# Patient Record
Sex: Female | Born: 1935 | Race: White | Hispanic: No | Smoking: Former smoker
Health system: Southern US, Community
[De-identification: ages and names within clinical notes are randomized; demographics above are authoritative.]

## PROBLEM LIST (undated history)

## (undated) DIAGNOSIS — E538 Deficiency of other specified B group vitamins: Secondary | ICD-10-CM

## (undated) DIAGNOSIS — F329 Major depressive disorder, single episode, unspecified: Secondary | ICD-10-CM

## (undated) DIAGNOSIS — J4489 Other specified chronic obstructive pulmonary disease: Secondary | ICD-10-CM

## (undated) DIAGNOSIS — F3289 Other specified depressive episodes: Secondary | ICD-10-CM

## (undated) DIAGNOSIS — F411 Generalized anxiety disorder: Secondary | ICD-10-CM

## (undated) DIAGNOSIS — J449 Chronic obstructive pulmonary disease, unspecified: Secondary | ICD-10-CM

## (undated) DIAGNOSIS — I10 Essential (primary) hypertension: Secondary | ICD-10-CM

## (undated) DIAGNOSIS — D696 Thrombocytopenia, unspecified: Secondary | ICD-10-CM

## (undated) DIAGNOSIS — K219 Gastro-esophageal reflux disease without esophagitis: Secondary | ICD-10-CM

## (undated) HISTORY — PX: ABDOMINOPLASTY: SHX5355

## (undated) HISTORY — DX: Other specified depressive episodes: F32.89

## (undated) HISTORY — DX: Gastro-esophageal reflux disease without esophagitis: K21.9

## (undated) HISTORY — PX: BREAST BIOPSY: SHX20

## (undated) HISTORY — DX: Deficiency of other specified B group vitamins: E53.8

## (undated) HISTORY — DX: Generalized anxiety disorder: F41.1

## (undated) HISTORY — DX: Thrombocytopenia, unspecified: D69.6

## (undated) HISTORY — PX: TOTAL ABDOMINAL HYSTERECTOMY: SHX209

## (undated) HISTORY — DX: Other specified chronic obstructive pulmonary disease: J44.89

## (undated) HISTORY — DX: Chronic obstructive pulmonary disease, unspecified: J44.9

## (undated) HISTORY — DX: Major depressive disorder, single episode, unspecified: F32.9

## (undated) HISTORY — DX: Essential (primary) hypertension: I10

---

## 1999-03-09 ENCOUNTER — Ambulatory Visit (HOSPITAL_COMMUNITY): Admission: RE | Admit: 1999-03-09 | Discharge: 1999-03-09 | Payer: Self-pay | Admitting: *Deleted

## 2000-03-21 ENCOUNTER — Ambulatory Visit (HOSPITAL_COMMUNITY): Admission: RE | Admit: 2000-03-21 | Discharge: 2000-03-21 | Payer: Self-pay | Admitting: Obstetrics and Gynecology

## 2000-03-21 ENCOUNTER — Encounter (INDEPENDENT_AMBULATORY_CARE_PROVIDER_SITE_OTHER): Payer: Self-pay | Admitting: Specialist

## 2001-01-27 ENCOUNTER — Encounter: Admission: RE | Admit: 2001-01-27 | Discharge: 2001-01-27 | Payer: Self-pay | Admitting: Internal Medicine

## 2001-01-27 ENCOUNTER — Encounter: Payer: Self-pay | Admitting: Internal Medicine

## 2002-01-25 ENCOUNTER — Other Ambulatory Visit: Admission: RE | Admit: 2002-01-25 | Discharge: 2002-01-25 | Payer: Self-pay | Admitting: Internal Medicine

## 2002-01-31 ENCOUNTER — Ambulatory Visit (HOSPITAL_COMMUNITY): Admission: RE | Admit: 2002-01-31 | Discharge: 2002-01-31 | Payer: Self-pay | Admitting: Internal Medicine

## 2003-02-14 ENCOUNTER — Encounter: Payer: Self-pay | Admitting: Gastroenterology

## 2003-02-14 ENCOUNTER — Encounter: Admission: RE | Admit: 2003-02-14 | Discharge: 2003-02-14 | Payer: Self-pay | Admitting: Gastroenterology

## 2003-07-18 ENCOUNTER — Encounter: Admission: RE | Admit: 2003-07-18 | Discharge: 2003-07-18 | Payer: Self-pay | Admitting: Internal Medicine

## 2003-07-18 ENCOUNTER — Encounter: Payer: Self-pay | Admitting: Internal Medicine

## 2004-09-08 ENCOUNTER — Ambulatory Visit (HOSPITAL_COMMUNITY): Admission: RE | Admit: 2004-09-08 | Discharge: 2004-09-08 | Payer: Self-pay | Admitting: Internal Medicine

## 2005-01-18 ENCOUNTER — Other Ambulatory Visit: Admission: RE | Admit: 2005-01-18 | Discharge: 2005-01-18 | Payer: Self-pay | Admitting: Internal Medicine

## 2006-07-04 ENCOUNTER — Encounter: Admission: RE | Admit: 2006-07-04 | Discharge: 2006-07-04 | Payer: Self-pay | Admitting: Orthopedic Surgery

## 2006-07-21 ENCOUNTER — Encounter: Admission: RE | Admit: 2006-07-21 | Discharge: 2006-07-21 | Payer: Self-pay | Admitting: Internal Medicine

## 2006-07-28 ENCOUNTER — Ambulatory Visit: Payer: Self-pay | Admitting: Hematology and Oncology

## 2006-07-29 LAB — CBC WITH DIFFERENTIAL/PLATELET
BASO%: 0.3 % (ref 0.0–2.0)
EOS%: 2.6 % (ref 0.0–7.0)
HCT: 44.3 % (ref 34.8–46.6)
LYMPH%: 25 % (ref 14.0–48.0)
MCH: 30 pg (ref 26.0–34.0)
MCHC: 34.6 g/dL (ref 32.0–36.0)
MCV: 86.7 fL (ref 81.0–101.0)
MONO#: 0.6 10*3/uL (ref 0.1–0.9)
MONO%: 9.8 % (ref 0.0–13.0)
NEUT%: 62.3 % (ref 39.6–76.8)
Platelets: 121 10*3/uL — ABNORMAL LOW (ref 145–400)

## 2006-07-29 LAB — COMPREHENSIVE METABOLIC PANEL
ALT: 33 U/L (ref 0–40)
Alkaline Phosphatase: 45 U/L (ref 39–117)
CO2: 32 mEq/L (ref 19–32)
Creatinine, Ser: 0.93 mg/dL (ref 0.40–1.20)
Total Bilirubin: 0.7 mg/dL (ref 0.3–1.2)

## 2006-08-05 LAB — PROTEIN ELECTROPHORESIS, SERUM
Albumin ELP: 64.2 % (ref 55.8–66.1)
Alpha-1-Globulin: 4.8 % (ref 2.9–4.9)
Alpha-2-Globulin: 10.9 % (ref 7.1–11.8)
Beta Globulin: 7.1 % (ref 4.7–7.2)
Total Protein, Serum Electrophoresis: 7.3 g/dL (ref 6.0–8.3)

## 2006-08-05 LAB — VON WILLEBRAND PANEL
Ristocetin-Cofactor: 91 % (ref 50–150)
Von Willebrand Ag: 89 % normal (ref 61–164)

## 2006-08-10 LAB — CBC WITH DIFFERENTIAL/PLATELET
Basophils Absolute: 0 10*3/uL (ref 0.0–0.1)
EOS%: 2.1 % (ref 0.0–7.0)
Eosinophils Absolute: 0.1 10*3/uL (ref 0.0–0.5)
HGB: 14.9 g/dL (ref 11.6–15.9)
MCH: 30 pg (ref 26.0–34.0)
NEUT#: 4.5 10*3/uL (ref 1.5–6.5)
RDW: 15.1 % — ABNORMAL HIGH (ref 11.3–14.5)
lymph#: 1.4 10*3/uL (ref 0.9–3.3)

## 2006-08-31 LAB — VITAMIN B12: Vitamin B-12: 898 pg/mL (ref 211–911)

## 2006-09-28 ENCOUNTER — Ambulatory Visit: Payer: Self-pay | Admitting: Hematology and Oncology

## 2006-10-20 ENCOUNTER — Encounter: Admission: RE | Admit: 2006-10-20 | Discharge: 2006-10-20 | Payer: Self-pay | Admitting: Internal Medicine

## 2006-11-30 ENCOUNTER — Ambulatory Visit: Payer: Self-pay | Admitting: Hematology and Oncology

## 2006-12-23 LAB — CBC WITH DIFFERENTIAL/PLATELET
Eosinophils Absolute: 0.1 10*3/uL (ref 0.0–0.5)
LYMPH%: 22.2 % (ref 14.0–48.0)
MONO#: 0.6 10*3/uL (ref 0.1–0.9)
NEUT#: 4.4 10*3/uL (ref 1.5–6.5)
Platelets: 128 10*3/uL — ABNORMAL LOW (ref 145–400)
RBC: 4.96 10*6/uL (ref 3.70–5.32)
WBC: 6.7 10*3/uL (ref 3.9–10.0)
lymph#: 1.5 10*3/uL (ref 0.9–3.3)

## 2006-12-23 LAB — VITAMIN B12: Vitamin B-12: 620 pg/mL (ref 211–911)

## 2006-12-23 LAB — COMPREHENSIVE METABOLIC PANEL
ALT: 37 U/L — ABNORMAL HIGH (ref 0–35)
Albumin: 4.6 g/dL (ref 3.5–5.2)
CO2: 26 mEq/L (ref 19–32)
Calcium: 10.6 mg/dL — ABNORMAL HIGH (ref 8.4–10.5)
Chloride: 101 mEq/L (ref 96–112)
Glucose, Bld: 110 mg/dL — ABNORMAL HIGH (ref 70–99)
Potassium: 4.1 mEq/L (ref 3.5–5.3)
Sodium: 137 mEq/L (ref 135–145)
Total Bilirubin: 0.4 mg/dL (ref 0.3–1.2)
Total Protein: 7.1 g/dL (ref 6.0–8.3)

## 2007-01-24 ENCOUNTER — Ambulatory Visit: Payer: Self-pay | Admitting: Hematology and Oncology

## 2007-03-21 ENCOUNTER — Ambulatory Visit: Payer: Self-pay | Admitting: Hematology and Oncology

## 2007-04-09 ENCOUNTER — Encounter
Admission: RE | Admit: 2007-04-09 | Discharge: 2007-04-09 | Payer: Self-pay | Admitting: Physical Medicine and Rehabilitation

## 2007-04-20 LAB — CBC WITH DIFFERENTIAL/PLATELET
Basophils Absolute: 0 10*3/uL (ref 0.0–0.1)
EOS%: 2.1 % (ref 0.0–7.0)
Eosinophils Absolute: 0.1 10*3/uL (ref 0.0–0.5)
HGB: 15 g/dL (ref 11.6–15.9)
LYMPH%: 21.3 % (ref 14.0–48.0)
MCH: 29.7 pg (ref 26.0–34.0)
MCV: 84.2 fL (ref 81.0–101.0)
MONO%: 9.1 % (ref 0.0–13.0)
Platelets: 115 10*3/uL — ABNORMAL LOW (ref 145–400)
RBC: 5.06 10*6/uL (ref 3.70–5.32)
RDW: 15.3 % — ABNORMAL HIGH (ref 11.3–14.5)

## 2007-04-20 LAB — IVY BLEEDING TIME

## 2007-05-17 ENCOUNTER — Ambulatory Visit: Payer: Self-pay | Admitting: Hematology and Oncology

## 2007-06-15 LAB — COMPREHENSIVE METABOLIC PANEL
ALT: 38 U/L — ABNORMAL HIGH (ref 0–35)
AST: 25 U/L (ref 0–37)
Alkaline Phosphatase: 59 U/L (ref 39–117)
BUN: 28 mg/dL — ABNORMAL HIGH (ref 6–23)
Calcium: 10.1 mg/dL (ref 8.4–10.5)
Chloride: 102 mEq/L (ref 96–112)
Creatinine, Ser: 0.87 mg/dL (ref 0.40–1.20)
Total Bilirubin: 0.5 mg/dL (ref 0.3–1.2)

## 2007-06-15 LAB — CBC WITH DIFFERENTIAL/PLATELET
BASO%: 0.4 % (ref 0.0–2.0)
Basophils Absolute: 0 10*3/uL (ref 0.0–0.1)
EOS%: 1.9 % (ref 0.0–7.0)
HCT: 41.4 % (ref 34.8–46.6)
HGB: 14.5 g/dL (ref 11.6–15.9)
LYMPH%: 18.7 % (ref 14.0–48.0)
MCH: 29.6 pg (ref 26.0–34.0)
MCHC: 35 g/dL (ref 32.0–36.0)
MCV: 84.6 fL (ref 81.0–101.0)
MONO%: 7.5 % (ref 0.0–13.0)
NEUT%: 71.5 % (ref 39.6–76.8)
lymph#: 1.1 10*3/uL (ref 0.9–3.3)

## 2007-06-28 ENCOUNTER — Ambulatory Visit: Payer: Self-pay | Admitting: Hematology and Oncology

## 2007-08-16 ENCOUNTER — Ambulatory Visit: Payer: Self-pay | Admitting: Hematology and Oncology

## 2007-10-25 ENCOUNTER — Ambulatory Visit: Payer: Self-pay | Admitting: Hematology and Oncology

## 2008-01-03 ENCOUNTER — Ambulatory Visit: Payer: Self-pay | Admitting: Hematology and Oncology

## 2008-01-05 LAB — CBC WITH DIFFERENTIAL/PLATELET
Basophils Absolute: 0 10*3/uL (ref 0.0–0.1)
HCT: 41.3 % (ref 34.8–46.6)
HGB: 14.1 g/dL (ref 11.6–15.9)
LYMPH%: 20.2 % (ref 14.0–48.0)
MONO#: 0.5 10*3/uL (ref 0.1–0.9)
NEUT%: 65.4 % (ref 39.6–76.8)
Platelets: 108 10*3/uL — ABNORMAL LOW (ref 145–400)
WBC: 4.5 10*3/uL (ref 3.9–10.0)
lymph#: 0.9 10*3/uL (ref 0.9–3.3)

## 2008-01-05 LAB — COMPREHENSIVE METABOLIC PANEL
BUN: 15 mg/dL (ref 6–23)
CO2: 27 mEq/L (ref 19–32)
Calcium: 9.6 mg/dL (ref 8.4–10.5)
Chloride: 99 mEq/L (ref 96–112)
Creatinine, Ser: 0.83 mg/dL (ref 0.40–1.20)
Glucose, Bld: 155 mg/dL — ABNORMAL HIGH (ref 70–99)

## 2008-01-05 LAB — VITAMIN B12: Vitamin B-12: 643 pg/mL (ref 211–911)

## 2008-02-28 ENCOUNTER — Ambulatory Visit: Payer: Self-pay | Admitting: Hematology and Oncology

## 2008-03-08 ENCOUNTER — Encounter: Admission: RE | Admit: 2008-03-08 | Discharge: 2008-03-08 | Payer: Self-pay | Admitting: Internal Medicine

## 2008-04-24 ENCOUNTER — Encounter: Admission: RE | Admit: 2008-04-24 | Discharge: 2008-04-24 | Payer: Self-pay | Admitting: Internal Medicine

## 2008-04-24 ENCOUNTER — Ambulatory Visit: Payer: Self-pay | Admitting: Hematology and Oncology

## 2008-05-29 LAB — CBC WITH DIFFERENTIAL/PLATELET
Basophils Absolute: 0 10*3/uL (ref 0.0–0.1)
Eosinophils Absolute: 0.1 10*3/uL (ref 0.0–0.5)
HCT: 43.2 % (ref 34.8–46.6)
HGB: 15.1 g/dL (ref 11.6–15.9)
NEUT#: 6.5 10*3/uL (ref 1.5–6.5)
NEUT%: 79 % — ABNORMAL HIGH (ref 39.6–76.8)
RDW: 17.2 % — ABNORMAL HIGH (ref 11.3–14.5)
lymph#: 1 10*3/uL (ref 0.9–3.3)

## 2008-05-29 LAB — BASIC METABOLIC PANEL
BUN: 34 mg/dL — ABNORMAL HIGH (ref 6–23)
CO2: 23 mEq/L (ref 19–32)
Chloride: 102 mEq/L (ref 96–112)
Creatinine, Ser: 0.87 mg/dL (ref 0.40–1.20)
Glucose, Bld: 243 mg/dL — ABNORMAL HIGH (ref 70–99)
Potassium: 3.9 mEq/L (ref 3.5–5.3)

## 2008-06-18 ENCOUNTER — Ambulatory Visit: Payer: Self-pay | Admitting: Hematology and Oncology

## 2008-08-14 ENCOUNTER — Ambulatory Visit: Payer: Self-pay | Admitting: Hematology and Oncology

## 2008-10-09 ENCOUNTER — Ambulatory Visit: Payer: Self-pay | Admitting: Hematology and Oncology

## 2008-11-26 ENCOUNTER — Ambulatory Visit: Payer: Self-pay | Admitting: Hematology and Oncology

## 2008-11-28 LAB — CBC WITH DIFFERENTIAL/PLATELET
BASO%: 0.4 % (ref 0.0–2.0)
EOS%: 3.5 % (ref 0.0–7.0)
HCT: 43.3 % (ref 34.8–46.6)
LYMPH%: 18.4 % (ref 14.0–48.0)
MCH: 27.5 pg (ref 26.0–34.0)
MCHC: 34 g/dL (ref 32.0–36.0)
MONO%: 14.3 % — ABNORMAL HIGH (ref 0.0–13.0)
NEUT%: 63.4 % (ref 39.6–76.8)
Platelets: 125 10*3/uL — ABNORMAL LOW (ref 145–400)
RBC: 5.35 10*6/uL — ABNORMAL HIGH (ref 3.70–5.32)

## 2008-11-29 LAB — VITAMIN B12: Vitamin B-12: 683 pg/mL (ref 211–911)

## 2008-11-29 LAB — BASIC METABOLIC PANEL
Calcium: 10.1 mg/dL (ref 8.4–10.5)
Creatinine, Ser: 0.82 mg/dL (ref 0.40–1.20)
Sodium: 138 mEq/L (ref 135–145)

## 2009-01-03 ENCOUNTER — Ambulatory Visit: Payer: Self-pay | Admitting: Hematology and Oncology

## 2009-02-27 ENCOUNTER — Ambulatory Visit: Payer: Self-pay | Admitting: Hematology and Oncology

## 2009-04-22 ENCOUNTER — Ambulatory Visit: Payer: Self-pay | Admitting: Hematology and Oncology

## 2009-06-17 ENCOUNTER — Ambulatory Visit: Payer: Self-pay | Admitting: Hematology and Oncology

## 2009-07-17 ENCOUNTER — Ambulatory Visit: Payer: Self-pay | Admitting: Hematology and Oncology

## 2009-08-01 ENCOUNTER — Encounter: Admission: RE | Admit: 2009-08-01 | Discharge: 2009-08-01 | Payer: Self-pay | Admitting: Internal Medicine

## 2009-08-06 ENCOUNTER — Encounter: Admission: RE | Admit: 2009-08-06 | Discharge: 2009-08-06 | Payer: Self-pay | Admitting: Internal Medicine

## 2009-08-25 ENCOUNTER — Ambulatory Visit: Payer: Self-pay | Admitting: Hematology and Oncology

## 2009-08-27 LAB — BASIC METABOLIC PANEL
BUN: 18 mg/dL (ref 6–23)
CO2: 24 mEq/L (ref 19–32)
Chloride: 103 mEq/L (ref 96–112)
Creatinine, Ser: 0.76 mg/dL (ref 0.40–1.20)
Potassium: 4.3 mEq/L (ref 3.5–5.3)

## 2009-08-27 LAB — CBC WITH DIFFERENTIAL/PLATELET
BASO%: 0.4 % (ref 0.0–2.0)
Eosinophils Absolute: 0.2 10*3/uL (ref 0.0–0.5)
HCT: 43.7 % (ref 34.8–46.6)
LYMPH%: 19.8 % (ref 14.0–49.7)
MCHC: 34.5 g/dL (ref 31.5–36.0)
MCV: 84.7 fL (ref 79.5–101.0)
MONO#: 0.5 10*3/uL (ref 0.1–0.9)
MONO%: 11.4 % (ref 0.0–14.0)
NEUT%: 64.5 % (ref 38.4–76.8)
Platelets: 101 10*3/uL — ABNORMAL LOW (ref 145–400)
RBC: 5.15 10*6/uL (ref 3.70–5.45)
WBC: 4 10*3/uL (ref 3.9–10.3)

## 2009-09-25 ENCOUNTER — Ambulatory Visit: Payer: Self-pay | Admitting: Hematology and Oncology

## 2009-09-25 LAB — CBC WITH DIFFERENTIAL/PLATELET
Basophils Absolute: 0 10*3/uL (ref 0.0–0.1)
Eosinophils Absolute: 0.2 10*3/uL (ref 0.0–0.5)
HGB: 15.4 g/dL (ref 11.6–15.9)
MCV: 83.2 fL (ref 79.5–101.0)
MONO%: 11.3 % (ref 0.0–14.0)
NEUT#: 2.5 10*3/uL (ref 1.5–6.5)
RDW: 14.8 % — ABNORMAL HIGH (ref 11.2–14.5)

## 2009-11-17 ENCOUNTER — Ambulatory Visit: Payer: Self-pay | Admitting: Hematology

## 2009-12-17 ENCOUNTER — Ambulatory Visit: Payer: Self-pay | Admitting: Hematology and Oncology

## 2009-12-20 IMAGING — CT CT PARANASAL SINUSES LIMITED
1 series · 16 of 22 positions shown, 20 images · non-contrast
Comparison: None

CLINICAL DATA: CT PARANASAL SINUSES WITHOUT CONTRAST, limited
TECHNIQUE: Multidetector CT through the paranasal sinuses was
performed using the standard protocol without intravenous contrast.

[Series 2: limited sinus prone · axial · 0.33mm/px · z∈[-60,+35]mm · 16 of 22 slices shown, 20 images]
[im 2/22  brain]
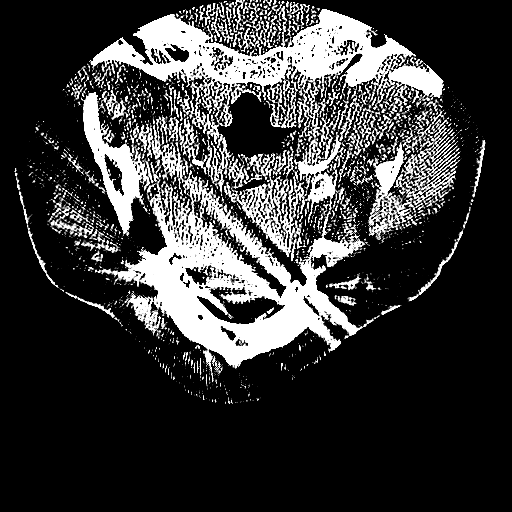
[im 2/22  bone]
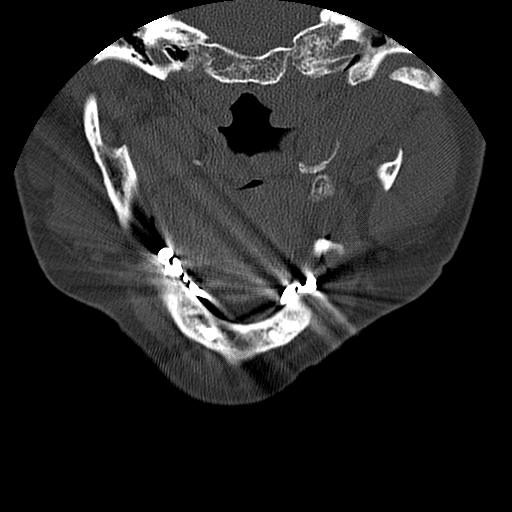
[im 3/22  bone]
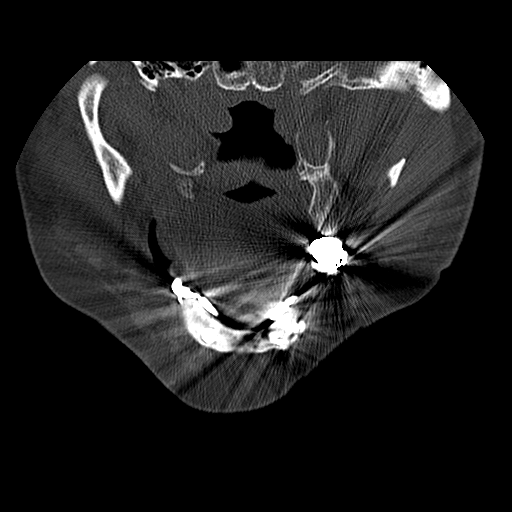
[im 5/22  bone]
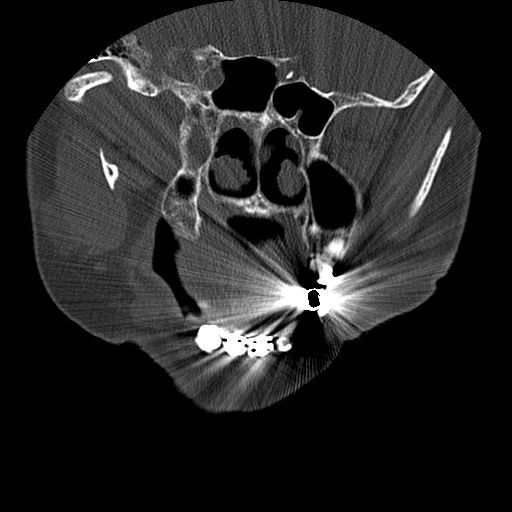
[im 6/22  bone]
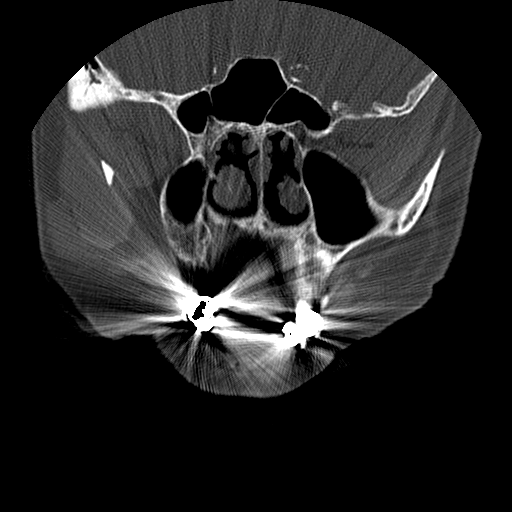
[im 7/22  brain]
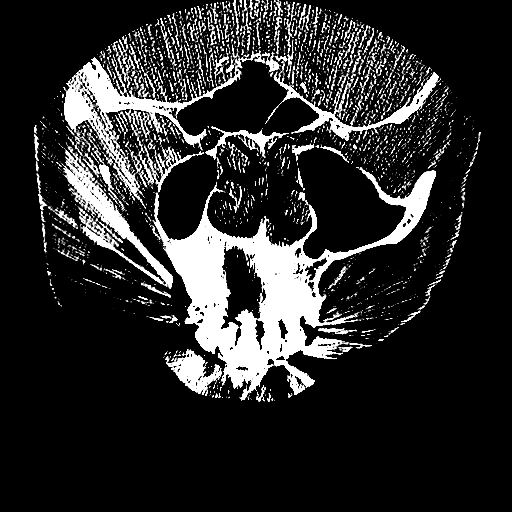
[im 7/22  bone]
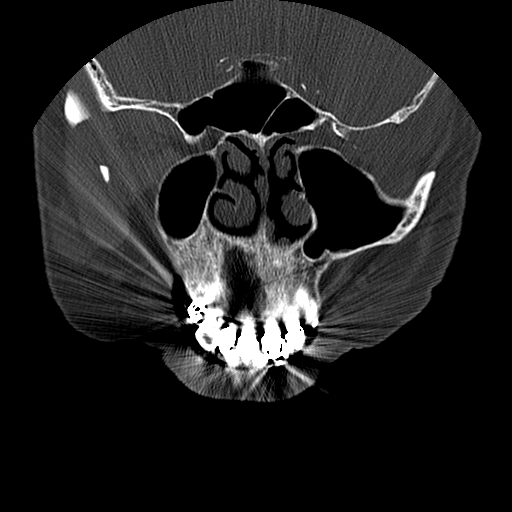
[im 8/22  bone]
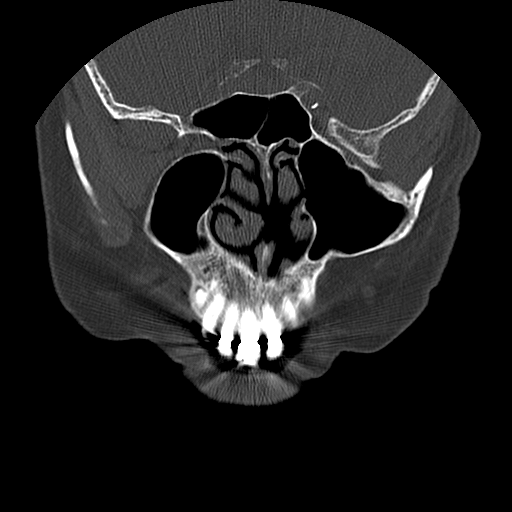
[im 10/22  bone]
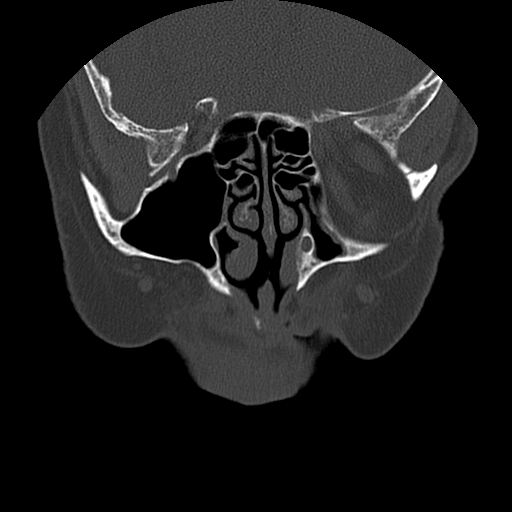
[im 11/22  bone]
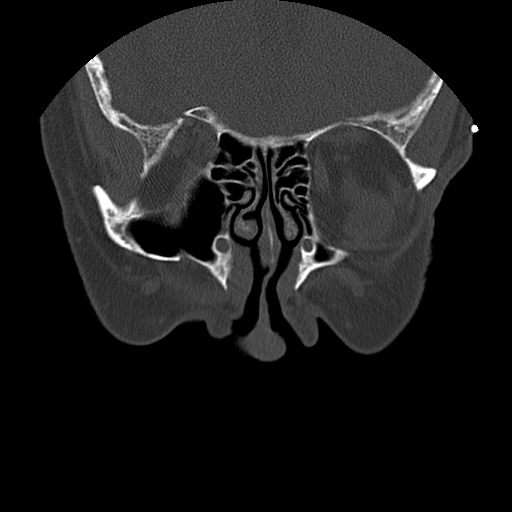
[im 12/22  brain]
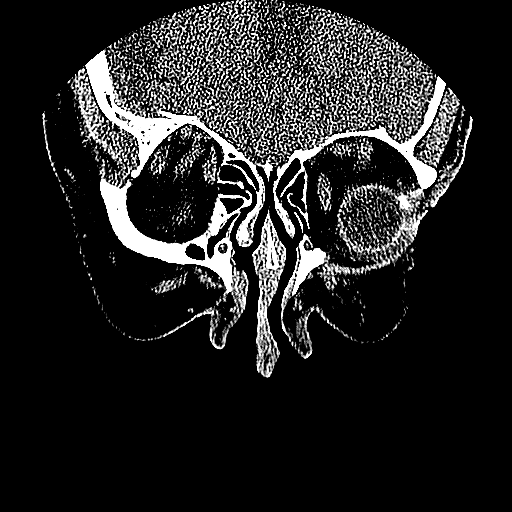
[im 12/22  bone]
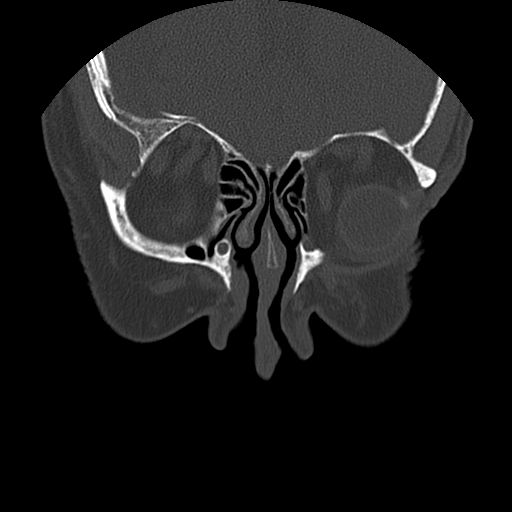
[im 13/22  bone]
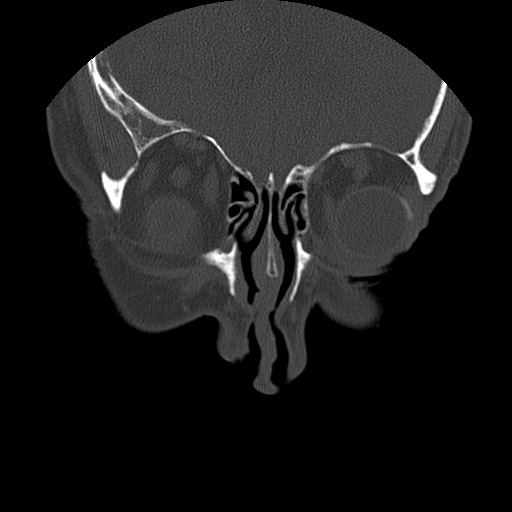
[im 15/22  bone]
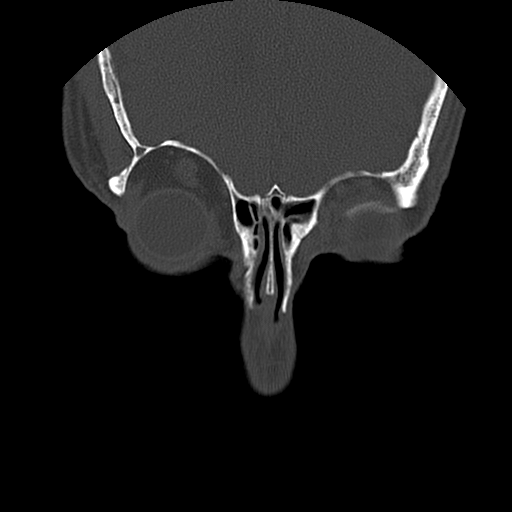
[im 16/22  bone]
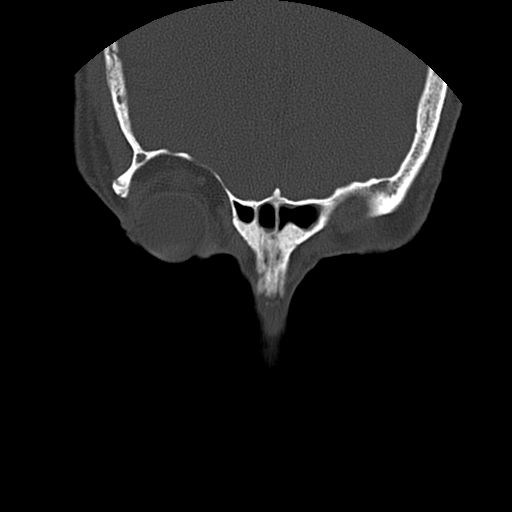
[im 17/22  brain]
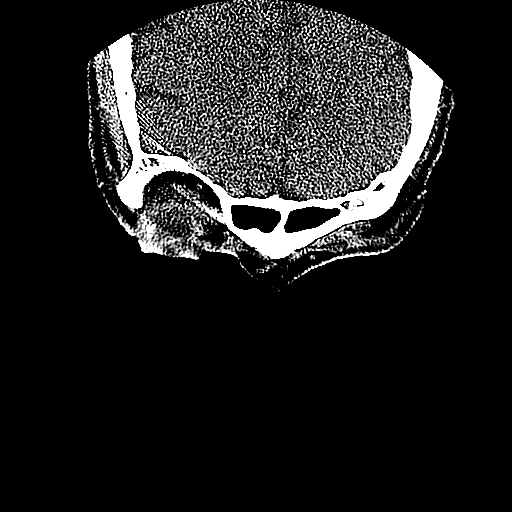
[im 17/22  bone]
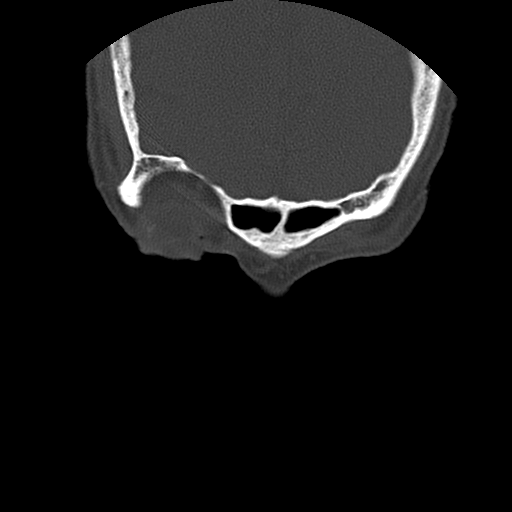
[im 18/22  bone]
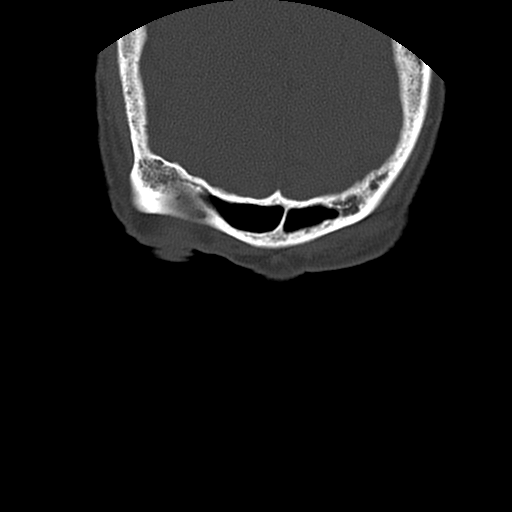
[im 20/22  bone]
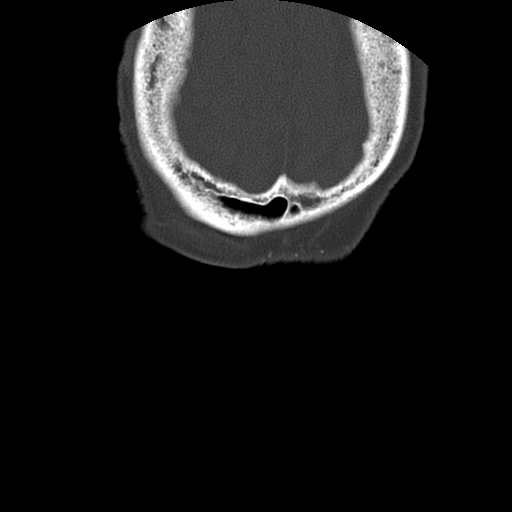
[im 21/22  bone]
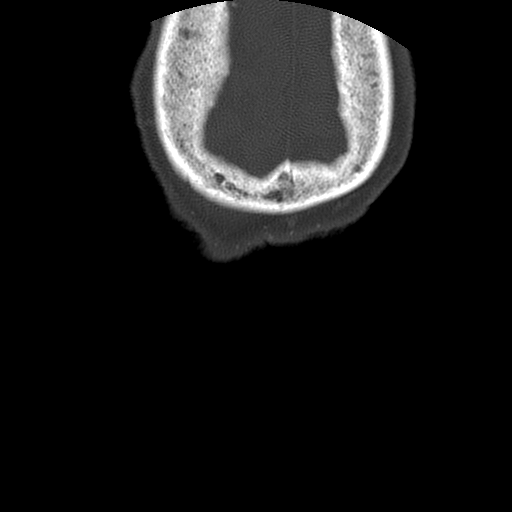

[16 of 22 positions shown; findings below may reference images not displayed]

FINDINGS: The paranasal sinuses are clear.  There is no evidence of
sinusitis.  The nasal turbinates are normal in size and position
and nasal airway is patent.  No bony abnormality is seen.
IMPRESSION: No sinusitis.

## 2010-02-02 ENCOUNTER — Encounter: Admission: RE | Admit: 2010-02-02 | Discharge: 2010-02-02 | Payer: Self-pay | Admitting: Internal Medicine
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
## 2010-02-09 ENCOUNTER — Ambulatory Visit: Payer: Self-pay | Admitting: Hematology and Oncology

## 2010-02-11 LAB — CBC WITH DIFFERENTIAL/PLATELET
BASO%: 0.4 % (ref 0.0–2.0)
EOS%: 4 % (ref 0.0–7.0)
HGB: 14.7 g/dL (ref 11.6–15.9)
MCH: 27.5 pg (ref 25.1–34.0)
MONO#: 0.5 10*3/uL (ref 0.1–0.9)
MONO%: 10.1 % (ref 0.0–14.0)
NEUT%: 63.1 % (ref 38.4–76.8)
Platelets: 114 10*3/uL — ABNORMAL LOW (ref 145–400)
RBC: 5.34 10*6/uL (ref 3.70–5.45)

## 2010-02-11 LAB — BASIC METABOLIC PANEL
BUN: 19 mg/dL (ref 6–23)
CO2: 28 mEq/L (ref 19–32)
Chloride: 100 mEq/L (ref 96–112)
Creatinine, Ser: 0.69 mg/dL (ref 0.40–1.20)
Glucose, Bld: 103 mg/dL — ABNORMAL HIGH (ref 70–99)
Sodium: 138 mEq/L (ref 135–145)

## 2010-02-11 LAB — VITAMIN B12: Vitamin B-12: 762 pg/mL (ref 211–911)

## 2010-04-06 ENCOUNTER — Ambulatory Visit: Payer: Self-pay | Admitting: Hematology and Oncology

## 2010-04-12 HISTORY — PX: CATARACT EXTRACTION: SUR2

## 2010-05-06 ENCOUNTER — Ambulatory Visit: Payer: Self-pay | Admitting: Pulmonary Disease

## 2010-05-06 ENCOUNTER — Inpatient Hospital Stay (HOSPITAL_COMMUNITY): Admission: EM | Admit: 2010-05-06 | Discharge: 2010-05-13 | Payer: Self-pay | Admitting: Emergency Medicine

## 2010-05-07 ENCOUNTER — Encounter (INDEPENDENT_AMBULATORY_CARE_PROVIDER_SITE_OTHER): Payer: Self-pay | Admitting: Internal Medicine

## 2010-05-12 ENCOUNTER — Telehealth (INDEPENDENT_AMBULATORY_CARE_PROVIDER_SITE_OTHER): Payer: Self-pay | Admitting: *Deleted

## 2010-05-15 DIAGNOSIS — J449 Chronic obstructive pulmonary disease, unspecified: Secondary | ICD-10-CM | POA: Insufficient documentation

## 2010-05-15 DIAGNOSIS — F411 Generalized anxiety disorder: Secondary | ICD-10-CM | POA: Insufficient documentation

## 2010-05-15 DIAGNOSIS — I10 Essential (primary) hypertension: Secondary | ICD-10-CM | POA: Insufficient documentation

## 2010-05-15 DIAGNOSIS — J4489 Other specified chronic obstructive pulmonary disease: Secondary | ICD-10-CM | POA: Insufficient documentation

## 2010-05-15 DIAGNOSIS — D696 Thrombocytopenia, unspecified: Secondary | ICD-10-CM

## 2010-05-15 DIAGNOSIS — E538 Deficiency of other specified B group vitamins: Secondary | ICD-10-CM

## 2010-05-15 DIAGNOSIS — K219 Gastro-esophageal reflux disease without esophagitis: Secondary | ICD-10-CM

## 2010-05-15 DIAGNOSIS — F329 Major depressive disorder, single episode, unspecified: Secondary | ICD-10-CM

## 2010-05-18 ENCOUNTER — Ambulatory Visit: Payer: Self-pay | Admitting: Internal Medicine

## 2010-05-27 ENCOUNTER — Telehealth (INDEPENDENT_AMBULATORY_CARE_PROVIDER_SITE_OTHER): Payer: Self-pay | Admitting: *Deleted

## 2010-06-01 ENCOUNTER — Ambulatory Visit: Payer: Self-pay | Admitting: Hematology and Oncology

## 2010-06-04 ENCOUNTER — Encounter: Payer: Self-pay | Admitting: Internal Medicine

## 2010-06-19 ENCOUNTER — Encounter: Payer: Self-pay | Admitting: Internal Medicine

## 2010-07-16 ENCOUNTER — Encounter: Payer: Self-pay | Admitting: Internal Medicine

## 2010-08-13 ENCOUNTER — Ambulatory Visit: Payer: Self-pay | Admitting: Hematology and Oncology

## 2010-08-18 LAB — CBC WITH DIFFERENTIAL/PLATELET
BASO%: 0.2 % (ref 0.0–2.0)
Basophils Absolute: 0 10*3/uL (ref 0.0–0.1)
Eosinophils Absolute: 0.2 10*3/uL (ref 0.0–0.5)
HCT: 39.3 % (ref 34.8–46.6)
MCH: 27.7 pg (ref 25.1–34.0)
MCHC: 33.3 g/dL (ref 31.5–36.0)
MCV: 83.1 fL (ref 79.5–101.0)
Platelets: 122 10*3/uL — ABNORMAL LOW (ref 145–400)
RBC: 4.74 10*6/uL (ref 3.70–5.45)
RDW: 15.4 % — ABNORMAL HIGH (ref 11.2–14.5)
WBC: 4.1 10*3/uL (ref 3.9–10.3)
lymph#: 0.7 10*3/uL — ABNORMAL LOW (ref 0.9–3.3)

## 2010-08-18 LAB — VITAMIN B12: Vitamin B-12: 753 pg/mL (ref 211–911)

## 2010-08-18 LAB — BASIC METABOLIC PANEL
BUN: 17 mg/dL (ref 6–23)
CO2: 29 mEq/L (ref 19–32)
Calcium: 10.5 mg/dL (ref 8.4–10.5)
Glucose, Bld: 136 mg/dL — ABNORMAL HIGH (ref 70–99)
Sodium: 139 mEq/L (ref 135–145)

## 2010-08-20 ENCOUNTER — Ambulatory Visit: Payer: Self-pay | Admitting: Internal Medicine

## 2010-08-20 DIAGNOSIS — J441 Chronic obstructive pulmonary disease with (acute) exacerbation: Secondary | ICD-10-CM | POA: Insufficient documentation

## 2010-08-20 DIAGNOSIS — J3489 Other specified disorders of nose and nasal sinuses: Secondary | ICD-10-CM | POA: Insufficient documentation

## 2010-09-14 ENCOUNTER — Ambulatory Visit: Payer: Self-pay | Admitting: Hematology and Oncology

## 2010-10-07 ENCOUNTER — Ambulatory Visit: Payer: Self-pay | Admitting: Internal Medicine

## 2010-10-08 ENCOUNTER — Telehealth: Payer: Self-pay | Admitting: Internal Medicine

## 2010-10-15 ENCOUNTER — Telehealth (INDEPENDENT_AMBULATORY_CARE_PROVIDER_SITE_OTHER): Payer: Self-pay | Admitting: *Deleted

## 2010-10-28 ENCOUNTER — Telehealth: Payer: Self-pay | Admitting: Internal Medicine

## 2010-11-06 ENCOUNTER — Ambulatory Visit: Payer: Self-pay | Admitting: Hematology and Oncology

## 2010-11-11 ENCOUNTER — Ambulatory Visit: Payer: Self-pay | Admitting: Internal Medicine

## 2010-11-12 ENCOUNTER — Telehealth: Payer: Self-pay | Admitting: Internal Medicine

## 2010-12-08 ENCOUNTER — Ambulatory Visit: Payer: Self-pay | Admitting: Hematology and Oncology

## 2010-12-25 ENCOUNTER — Encounter: Payer: Self-pay | Admitting: Internal Medicine

## 2011-01-01 ENCOUNTER — Telehealth (INDEPENDENT_AMBULATORY_CARE_PROVIDER_SITE_OTHER): Payer: Self-pay | Admitting: *Deleted

## 2011-01-05 ENCOUNTER — Encounter: Payer: Self-pay | Admitting: Internal Medicine

## 2011-01-14 NOTE — Miscellaneous (Signed)
Summary: Patient declined/[facility]  Patient declined/[facility]   Imported By: Lester Dante 07/06/2010 10:44:58  _____________________________________________________________________  External Attachment:    Type:   Image     Comment:   External Document

## 2011-01-14 NOTE — Miscellaneous (Signed)
Summary: Plan of Care & Treatment/[facility]  Plan of Care & Treatment/[facility]   Imported By: Sherian Rein 07/21/2010 14:58:16  _____________________________________________________________________  External Attachment:    Type:   Image     Comment:   External Document

## 2011-01-14 NOTE — Miscellaneous (Signed)
Summary: Orders/[facility]  Orders/[facility]   Imported By: Lester Rheems 06/10/2010 09:33:12  _____________________________________________________________________  External Attachment:    Type:   Image     Comment:   External Document

## 2011-01-14 NOTE — Progress Notes (Signed)
Summary: appt  Phone Note Call from Patient Call back at [phone]   Caller: Margo lindsay/friend Call For: hospital pt Summary of Call: Pt is being released from hospital tomorrow, has appt sch for 6/13 @ 8:45a needs to rsc for the week of June 6th for a later time in the evening, pls advise. Initial call taken by: Darletta Moll,  May 12, 2010 1:47 PM  Follow-up for Phone Call        called and spoke with pt.  offered pt an appt with MW for the week of June 6.  Pt refused an appt with MW stating she did not see MW while in hosp and has only seen SN and Yacoub and requests to only follow up with SN.  Will forward message to SN to address.  Aundra Millet Reynolds LPN  May 12, 2010 2:09 PM    PER SN:  He sees no new pts.  Pt hosp f/u appts can be seen by any other physician in the practice.    Called and spoke with pt and advised her of this.  Pt states she was seen by PW today and wishes to schedule post hosp f/u appt with him.  Pt states she will call back to schedule post hosp f/u appt. Nothing further needed. Arman Filter LPN  May 12, 2010 3:46 PM

## 2011-01-14 NOTE — Assessment & Plan Note (Signed)
Summary: 2-3 week/mhh   Visit Type:  Follow-up Copy to:  DR Burney Gauze Primary Provider/Referring Provider:  Dr Burney Gauze  CC:  4 week follow-up. pt c/o mucus in her throat. She states she feels better after abx and prednisone taper. .  History of Present Illness:  77 year old ex-smoker. 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     October 07, 2010 Last OV was 08/20/2010: AECOPD - rx opd abx and steroids and atenolol changed to bystolic samples.  After that did real well and symptoms resolved. Now 10 days ago developed sore throat and runny nose. One week ago ran out of brovana and pulmicort. With that symptoms are worse. More dyspneic. More haorse voice.Feels tired all the time. Denies chest pain, nausea, vomit, diarrhea, symcope, hemoptysis, sputum, fever, chills, edema. Very worried about oveall prognosis. REC: Pred taper, omnicef, start roflumilast, use netti pot, continue bystolic   November 11, 2010. Followup. AFter last visit had one more AECOPD that was managed over phone with doxy and prednisone. Now feeling well and back to baseline. Continues to be worried about overall prognosis - lives alone and no social support. SHe could not afford ROFLUMILAST so never took it. Otherwise, no complaints.    Preventive Screening-Counseling & Management  Alcohol-Tobacco     Smoking Status: quit     Packs/Day: 2.0     Year Started: 1966     Year Quit: 2008     Pack years: 48  Current Medications (verified): 1)  Furosemide 40 Mg Tabs (Furosemide) .... Take 1 Tablet By Mouth Once A Day 2)  Prozac 20 Mg Caps (Fluoxetine Hcl) .... Take 1 Capsule By Mouth Two Times A Day 3)  Protonix 40 Mg Tbec (Pantoprazole Sodium) .... Take 1 Tablet By Mouth Once A Day 4)  Xanax 0.25 Mg Tabs (Alprazolam) .... Take 1 Tablet By Mouth Two Times A Day 5)  Klor-Con 10 10 Meq Cr-Tabs (Potassium Chloride) .... Take 1 Tablet By  Mouth Once A Day 6)  Metformin Hcl 500 Mg Tabs (Metformin Hcl) .... Take 1 Tablet By Mouth Two Times A Day 7)  Brovana 15 Mcg/74ml Nebu (Arformoterol Tartrate) .... Two Times A Day 8)  Pulmicort 0.25 Mg/58ml Susp (Budesonide) .... Two Times A Day 9)  Spiriva Handihaler 18 Mcg Caps (Tiotropium Bromide Monohydrate) .... Once Daily 10)  Mucinex Dm 30-600 Mg Xr12h-Tab (Dextromethorphan-Guaifenesin) .... Two Times A Day 11)  Senna 187 Mg Tabs (Senna) .... 2 Once Daily As Needed 12)  Cyanocobalamin 1000 Mcg/ml Soln (Cyanocobalamin) .... Once Monthly 13)  Calcium Carbonate 600 Mg Tabs (Calcium Carbonate) .... Take 1 Tablet By Mouth Two Times A Day 14)  Nasonex 50 Mcg/act Susp (Mometasone Furoate) .... Once Daily As Needed 15)  Oxygen .... 2l Cont 16)  Albuterol Sulfate (2.5 Mg/55ml) 0.083% Nebu (Albuterol Sulfate) .... One Vial As Needed 17)  Bystolic 5 Mg Tabs (Nebivolol Hcl) .... Take 1 Tablet By Mouth Once A Day  Allergies (verified): 1)  Tetracycline 2)  Codeine  Past History:  Past medical, surgical, family and social histories (including risk factors) reviewed, and no changes noted (except as noted below).  Past Medical History: B12 DEFICIENCY (ICD-266.2) THROMBOCYTOPENIA, CHRONIC (ICD-287.5) GERD (ICD-530.81) HYPERTENSION (ICD-401.9) DEPRESSION (ICD-311) ANXIETY (ICD-300.00) COPD (ICD-496)  - New diagnosis May/June 2011  - Gold stage 2-3 with exertional hyooxemia. BAseline class 3 dyspnea AECOPD  - MAY/Jne 2011 - hospitalized  -  Sept 2011 - Rx opd with abx/steroids  - Oct 2011 and Nov 2011 - Rx opd abx/steroids  Past Surgical History: Reviewed history from 05/18/2010 and no changes required. hysterectomy 69 tummy tuck 80's throat surgery 80's x 2 cateract surgery 04/2010 breast biopsy  Past Pulmonary History:  Pulmonary History: admite AECOPD - May/June 2011  Family History: Reviewed history from 05/18/2010 and no changes  required. Father-CHF Son-hyperlipedemia  Social History: Reviewed history from 05/18/2010 and no changes required. Patient states former smoker.  Quit 2008.  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  The patient denies productive cough, non-productive cough, coughing up blood, chest pain, irregular heartbeats, acid heartburn, indigestion, loss of appetite, weight change, abdominal pain, difficulty swallowing, sore throat, tooth/dental problems, headaches, nasal congestion/difficulty breathing through nose, sneezing, itching, ear ache, anxiety, depression, hand/feet swelling, joint stiffness or pain, rash, change in color of mucus, and fever.    Vital Signs:  Patient profile:   77 year old female Height:      60 inches Weight:      181.13 pounds BMI:     35.50 O2 Sat:      93 % on 2 L/min Temp:     97.9 degrees F oral Pulse rate:   75 / minute BP sitting:   138 / 74  (right arm) Cuff size:   regular  Vitals Entered By: Carron Curie CMA (November 11, 2010 12:07 PM)  O2 Flow:  2 L/min CC: 4 week follow-up. pt c/o mucus in her throat. She states she feels better after abx and prednisone taper.  Comments Medications reviewed with patient Carron Curie CMA  November 11, 2010 12:08 PM Daytime phone number verified with patient.    Physical Exam  General:  well developed, well nourished, in no acute distress Head:  normocephalic and atraumatic Eyes:  PERRLA/EOM intact; conjunctiva and sclera clear Ears:  TMs intact and clear with normal canals Nose:  no deformity, discharge, inflammation, or lesions Mouth:  no deformity or lesions no thrush Neck:  no masses, thyromegaly, or abnormal cervical nodes Chest Wall:   kyphosis.   Lungs:  decreased BS bilateral and coarse BS throughout. no wheeze no distress baseline purse lip breathing +  Heart:  regular rate and rhythm, S1, S2 without murmurs, rubs, gallops, or clicks Abdomen:  bowel sounds positive; abdomen soft and non-tender without masses, or organomegaly Msk:  no deformity or scoliosis noted with normal posture Pulses:  pulses normal Extremities:  no clubbing, cyanosis, edema, or deformity noted Neurologic:  CN II-XII grossly intact with normal reflexes, coordination, muscle strength and tone Skin:  intact without lesions or rashes Cervical Nodes:  no significant adenopathy Axillary Nodes:  no significant adenopathy Psych:  alert and cooperative; normal mood and affect; normal attention span and concentration   Impression & Recommendations:  Problem # 1:  COPD (ICD-496) Assessment Unchanged stable disease. BUt having recurrent AECOPD. COuld not afford roflumilast  plan continune current BD regimen add erythromycin macrolide in an attempt to prevent AECOPD discussed end of life issues - no social support. Have asked her to inform her friend MArgo of wishes and attempt to get living will done  - do not recommend  CPR or ventilation over 5 days get home nursing assessment  Medications Added to Medication List This Visit: 1)  Bystolic 5 Mg Tabs (Nebivolol hcl) .... Take 1 tablet by mouth once a day 2)  Erythromycin Base 250 Mg Tabs (Erythromycin base) .... Take 1 tablet daily after meals  Other Orders: Social Work Referral (Social ) Prescription Created Electronically [phone]) Est. Patient Level III [phone])  Patient Instructions: 1)  continue your medicines as before 2)  add daily erythromycin after food 3)   - watch out stomach cramps and diarrhea 4)   - this is mean to keep your bronchitis attack away 5)  meet with social worker to get your living will done 6)   - i do not recommend CPR in case of cardiac arrest 7)   - I do not  recommend mechanical ventilation beyond 5 days 8)  return in 2-3 months Prescriptions: ERYTHROMYCIN BASE 250 MG TABS (ERYTHROMYCIN BASE) take 1 tablet daily after meals  #30 x 3   Entered and Authorized by:   Kalman Shan MD   Signed by:   Kalman Shan MD on 11/11/2010   Method used:   Electronically to        [facility]  [phone]* (retail)       [address]       [place], [place]  [address]       Ph: [phone] or [phone]       Fax: [phone]   RxID:   [id number]

## 2011-01-14 NOTE — Assessment & Plan Note (Signed)
Summary: 3 MONTHS F/U ///kp   Visit Type:  Follow-up Copy to:  DR Burney Gauze Primary Orian Figueira/Referring Dulcemaria Bula:  Dr Burney Gauze  CC:  Pt here for follow-up. Marland Kitchen  History of Present Illness:  76 year old ex-smoker. Followup Gold stage 2-3 O2 COPD (Fev1 0.8L/51% and baseline desaturation to 85% after walking 75-135feet  in June 2011).    OV 08/20/2010: Last visit was in June 2011. Was doing well/baseline. She refused rehab. Past 3 days incrased nasal congestion and increaesd dyspnea compared to her baseline class 3.  Per hx pulse yesterday at PMD office was 95% on 2L [place] but today it is 90% on RA. Having slightly more dyspnea with activities. Denies fever, sputum, wheeze, chest pain, syncope.   Preventive Screening-Counseling & Management  Alcohol-Tobacco     Smoking Status: quit     Packs/Day: 2.0     Year Started: 1966     Year Quit: 2008     Pack years: 67  Current Medications (verified): 1)  Tenormin 50 Mg Tabs (Atenolol) .... Take 1 Tablet By Mouth Once A Day 2)  Furosemide 40 Mg Tabs (Furosemide) .... Take 1 Tablet By Mouth Once A Day 3)  Prozac 20 Mg Caps (Fluoxetine Hcl) .... Take 1 Capsule By Mouth Two Times A Day 4)  Protonix 40 Mg Tbec (Pantoprazole Sodium) .... Take 1 Tablet By Mouth Once A Day 5)  Xanax 0.25 Mg Tabs (Alprazolam) .... Take 1 Tablet By Mouth Two Times A Day 6)  Klor-Con 10 10 Meq Cr-Tabs (Potassium Chloride) .... Take 1 Tablet By Mouth Once A Day 7)  Metformin Hcl 500 Mg Tabs (Metformin Hcl) .... Take 1 Tablet By Mouth Two Times A Day 8)  Brovana 15 Mcg/65ml Nebu (Arformoterol Tartrate) .... Two Times A Day 9)  Pulmicort 0.25 Mg/39ml Susp (Budesonide) .... Two Times A Day 10)  Spiriva Handihaler 18 Mcg Caps (Tiotropium Bromide Monohydrate) .... Once Daily 11)  Mucinex Dm 30-600 Mg Xr12h-Tab (Dextromethorphan-Guaifenesin) .... Two Times A Day 12)  Senna 187 Mg Tabs (Senna) .... 2 Once Daily As Needed 13)  Cyanocobalamin 1000 Mcg/ml Soln (Cyanocobalamin) .... Once  Monthly 14)  Calcium Carbonate 600 Mg Tabs (Calcium Carbonate) .... Take 1 Tablet By Mouth Two Times A Day 15)  Nasonex 50 Mcg/act Susp (Mometasone Furoate) .... Once Daily As Needed 16)  Oxygen .... 2l Cont 17)  Albuterol Sulfate (2.5 Mg/30ml) 0.083% Nebu (Albuterol Sulfate) .Marland Kitchen.. 1 Vial Qid  Allergies (verified): 1)  Tetracycline 2)  Codeine  Past History:  Past medical, surgical, family and social histories (including risk factors) reviewed, and no changes noted (except as noted below).  Past Medical History: B12 DEFICIENCY (ICD-266.2) THROMBOCYTOPENIA, CHRONIC (ICD-287.5) GERD (ICD-530.81) HYPERTENSION (ICD-401.9) DEPRESSION (ICD-311) ANXIETY (ICD-300.00) COPD (ICD-496)  - New diagnosis May/June 2011  - Gold stage 2-3 with exertional hyooxemia. BAseline class 3 dyspnea  Past Surgical History: Reviewed history from 05/18/2010 and no changes required. hysterectomy 69 tummy tuck 80's throat surgery 80's x 2 cateract surgery 04/2010 breast biopsy  Past Pulmonary History:  Pulmonary History: admite AECOPD - May/June 2011  Family History: Reviewed history from 05/18/2010 and no changes required. Father-CHF Son-hyperlipedemia  Social History: Reviewed history from 05/18/2010 and no changes required. Patient states former smoker.  Quit 2008.  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  The patient denies shortness of breath at rest, productive cough, non-productive cough, coughing up blood, chest pain, irregular heartbeats, acid heartburn, indigestion, loss of appetite, weight change, abdominal pain, difficulty swallowing, sore throat, tooth/dental problems, headaches, sneezing, itching, ear ache, anxiety, depression, hand/feet swelling, joint stiffness or pain, rash, change in  color of mucus, and fever.    Vital Signs:  Patient profile:   77 year old female Height:      60 inches Weight:      173 pounds BMI:     33.91 O2 Sat:      90 % on 2 L/min Temp:     97.8 degrees F oral Pulse rate:   69 / minute BP sitting:   144 / 76  (right arm) Cuff size:   regular  Vitals Entered By: Carron Curie CMA (August 20, 2010 2:30 PM)  O2 Flow:  2 L/min CC: Pt here for follow-up.  Comments Medications reviewed with patient Carron Curie CMA  August 20, 2010 2:35 PM Daytime phone number verified with patient.    Physical Exam  General:  well developed, well nourished, in no acute distress Head:  normocephalic and atraumatic Eyes:  PERRLA/EOM intact; conjunctiva and sclera clear Ears:  TMs intact and clear with normal canals Nose:  no deformity, discharge, inflammation, or lesions Mouth:  no deformity or lesions Neck:  no masses, thyromegaly, or abnormal cervical nodes Chest Wall:  kyphosis.   Lungs:  decreased BS bilateral and coarse BS throughout. no wheeze no distress baseline purse lip breathing + pulse ox 87% on RA - worse than baseline  Heart:  regular rate and rhythm, S1, S2 without murmurs, rubs, gallops, or clicks Abdomen:  bowel sounds positive; abdomen soft and non-tender without masses, or organomegaly Msk:  no deformity or scoliosis noted with normal posture Pulses:  pulses normal Extremities:  no clubbing, cyanosis, edema, or deformity noted Neurologic:  CN II-XII grossly intact with normal reflexes, coordination, muscle strength and tone Skin:  intact without lesions or rashes Cervical Nodes:  no significant adenopathy Axillary Nodes:  no significant adenopathy Psych:  alert and cooperative; normal mood and affect; normal attention span and concentration   Impression & Recommendations:  Problem # 1:  OTHER DISEASES OF NASAL CAVITY AND SINUSES (ICD-478.19) Assessment New  chronic nasal discharge +. Now  worse  plan continue flonase start netti pot  Orders: Est. Patient Level V (04540)  Problem # 2:  C O P D WITH ACUTE EXACERBATION (ICD-491.21) Assessment: New  Mild exacerbation today 08/20/2010.  plan zpak pred taper return sooner or go to er if worse  Orders: Prescription Created Electronically [phone]) Est. Patient Level V (14782)  Problem # 3:  COPD (ICD-496) Assessment: Deteriorated PLAN continue spiriva, pulmicort, brvana, o2 chck alpha 1 today flu shot strongly recommended rehab - she will reconsider her refusal STOP ATENOLOL - (per hx she was started on this for migraines 20 years ago. But also hashx of hypertension) . So, will give her bystolic 5mg  samples. Will review bp and dyspnea in 4-5 weeks  Medications Added to Medication List This Visit: 1)  Bystolic 5 Mg Tabs (Nebivolol hcl) .... One tablet daily 2)  Zithromax Z-pak 250 Mg Tabs (Azithromycin) .... Take 1 course 3)  Prednisone 10 Mg Tabs (Prednisone) .... Take  then 4 tablets daily x2 days, then 3 tablets daily x2 days, then 2 tablets daily x2 days, then 1 tablet daily x2 days, then  stop  Other Orders: Flu Vaccine 75yrs + MEDICARE PATIENTS (Z6109) Administration Flu vaccine - MCR (G0008) T- * Misc. Laboratory test [phone])  Patient Instructions: 1)  #COPD 2)  - continue spiriva, pulmicort, brovana and oxygen 3)  - you are having a mild attack - so take zpak and short course prednisone 4)  - we wil send out blood work for alpha 1 antitrypsin genetic cause of copd 5)  - have flu shot today 6)  - you need to seriously reconsider your decision not to undergo rehab 7)  #MEDS 8)  - stop atenolol 9)  - take bystolic 5mg  per day instead - take 6 week sample 10)  #NASAL CONGESTION 11)  - take netti pot saline wash - get sample pot or picture from my nurse 12)  #FOLLOWUP 13)  - 4-5 weeks or sooner if worse Prescriptions: PREDNISONE 10 MG  TABS (PREDNISONE) Take  then 4 tablets daily x2 days, then 3 tablets  daily x2 days, then 2 tablets daily x2 days, then 1 tablet daily x2 days, then stop  #20 x 1   Entered and Authorized by:   Kalman Shan MD   Signed by:   Kalman Shan MD on 08/20/2010   Method used:   Electronically to        [facility]  [phone]* (retail)       [address]       [place], [place]  [address]       Ph: [phone] or [phone]       Fax: [phone]   RxID:   [id number] ZITHROMAX Z-PAK 250 MG TABS (AZITHROMYCIN) take 1 course  #1 x 1   Entered and Authorized by:   Kalman Shan MD   Signed by:   Kalman Shan MD on 08/20/2010   Method used:   Electronically to        [facility]  [phone]* (retail)       [address]       [place], [place]  [address]       Ph: [phone] or [phone]       Fax: [phone]   RxID:   [id number] BYSTOLIC 5 MG  TABS (NEBIVOLOL HCL) One tablet daily  #30 x 1   Entered and Authorized by:   Kalman Shan MD   Signed by:   Kalman Shan MD on 08/20/2010   Method used:   Historical   RxID:   [id number]     Flu Vaccine Consent Questions     Do you have a history of severe allergic reactions to this vaccine? no    Any prior history of allergic reactions to egg and/or gelatin? no    Do you have a sensitivity to the preservative Thimersol? no    Do you have a past history of Guillan-Barre Syndrome? no    Do you currently have an acute febrile illness? no    Have you ever had a severe reaction to latex? no    Vaccine information given and explained to patient? yes    Are you currently pregnant? no    Lot Number:[id number]   Exp Date:06/12/2011   Site Given  Left Deltoid IMu Carron Curie CMA  August 20, 2010 2:57 PM  Appended Document: 3 MONTHS F/U ///kp Pt O2-87%/88% RA at rest. jwr

## 2011-01-14 NOTE — Progress Notes (Signed)
Summary: dailresp too expensive  Phone Note From Pharmacy   Caller: [facility]  [address]  [phone]* Call For: Bridget Fry  Summary of Call: Pharmacists called and wanted to clarify that cefdinir was replacing doxy. I advised that was correct. Pharmacists also stated that the Dailresp was $197 and pt is requesting a replacement. Please advise.Carron Curie CMA  October 08, 2010 4:56 PM  Initial call taken by: Carron Curie CMA,  October 08, 2010 4:56 PM  Follow-up for Phone Call        no replacement unfortunately. No samples either. See if there is discount card Follow-up by: Kalman Shan MD,  October 08, 2010 5:24 PM  Additional Follow-up for Phone Call Additional follow up Details #1::        Pt called again about medication not being covered. There is no discount card, we do have a small supply of samples but not enough to sustain the pt. Pt is VERY anxious about not having this medication. I advised pt this will take some time to figure out but we are working on it and I will let her know as soon as possible. Please advise.  Carron Curie CMA  October 09, 2010 9:33 AM     Additional Follow-up for Phone Call Additional follow up Details #2::    spoke to patient. REassured. Howeve,r she states per [employer] company denied the medicine. Could you please investigate along those lines to see how we can get insurance company to approver the medicine for her Follow-up by: Kalman Shan MD,  October 09, 2010 5:51 PM  Additional Follow-up for Phone Call Additional follow up Details #3:: Details for Additional Follow-up Action Taken: medication was not denied from insurance it is just at a high copay tier. We cannot do a PA on this medication. Carron Curie CMA  October 15, 2010 12:11 PM  ..... That is fine. I have explained to her that this medicine is not essential esp if she cannot afford it. SHe is understanding. Please let her know the issue  with insurance and reiterate it is not essential Additional Follow-up by: Kalman Shan MD,  October 16, 2010 4:59 PM  Pt aware of insurance issues. Carron Curie CMA  October 20, 2010 8:36 AM

## 2011-01-14 NOTE — Assessment & Plan Note (Signed)
Summary: 4-5 weeks/apc   Visit Type:  Follow-up Copy to:  DR Burney Gauze Primary Provider/Referring Provider:  Dr Burney Gauze  CC:  Pt here for follow-up. Pt c/o runny nose and and sore throat and hoarseness.Bridget Fry  History of Present Illness:  77 year old ex-smoker. Followup Gold stage 2-3 O2 COPD MM genotype (Fev1 0.8L/51% and baseline desaturation to 85% after walking 75-179feet  in June 2011).     October 07, 2010 Last OV was 08/20/2010: AECOPD - rx opd abx and steroids and atenolol changed to bystolic samples.  After that did real well and symptoms resolved. Now 10 days ago developed sore throat and runny nose. One week ago ran out of brovana and pulmicort. With that symptoms are worse. More dyspneic. More haorse voice.Feels tired all the time. Denies chest pain, nausea, vomit, diarrhea, symcope, hemoptysis, sputum, fever, chills, edema. Very worried about oveall prognosis.   Preventive Screening-Counseling & Management  Alcohol-Tobacco     Smoking Status: quit     Packs/Day: 2.0     Year Started: 1968     Year Quit: 2008     Pack years: 18  Current Medications (verified): 1)  Furosemide 40 Mg Tabs (Furosemide) .... Take 1 Tablet By Mouth Once A Day 2)  Prozac 20 Mg Caps (Fluoxetine Hcl) .... Take 1 Capsule By Mouth Two Times A Day 3)  Protonix 40 Mg Tbec (Pantoprazole Sodium) .... Take 1 Tablet By Mouth Once A Day 4)  Xanax 0.25 Mg Tabs (Alprazolam) .... Take 1 Tablet By Mouth Two Times A Day 5)  Klor-Con 10 10 Meq Cr-Tabs (Potassium Chloride) .... Take 1 Tablet By Mouth Once A Day 6)  Metformin Hcl 500 Mg Tabs (Metformin Hcl) .... Take 1 Tablet By Mouth Two Times A Day 7)  Brovana 15 Mcg/5ml Nebu (Arformoterol Tartrate) .... Two Times A Day 8)  Pulmicort 0.25 Mg/66ml Susp (Budesonide) .... Two Times A Day 9)  Spiriva Handihaler 18 Mcg Caps (Tiotropium Bromide Monohydrate) .... Once Daily 10)  Mucinex Dm 30-600 Mg Xr12h-Tab (Dextromethorphan-Guaifenesin) .... Two Times A Day 11)  Senna  187 Mg Tabs (Senna) .... 2 Once Daily As Needed 12)  Cyanocobalamin 1000 Mcg/ml Soln (Cyanocobalamin) .... Once Monthly 13)  Calcium Carbonate 600 Mg Tabs (Calcium Carbonate) .... Take 1 Tablet By Mouth Two Times A Day 14)  Nasonex 50 Mcg/act Susp (Mometasone Furoate) .... Once Daily As Needed 15)  Oxygen .... 2l Cont 16)  Albuterol Sulfate (2.5 Mg/23ml) 0.083% Nebu (Albuterol Sulfate) .... One Vial As Needed  Allergies (verified): 1)  Tetracycline 2)  Codeine  Past History:  Past medical, surgical, family and social histories (including risk factors) reviewed, and no changes noted (except as noted below).  Past Medical History: B12 DEFICIENCY (ICD-266.2) THROMBOCYTOPENIA, CHRONIC (ICD-287.5) GERD (ICD-530.81) HYPERTENSION (ICD-401.9) DEPRESSION (ICD-311) ANXIETY (ICD-300.00) COPD (ICD-496)  - New diagnosis May/June 2011  - Gold stage 2-3 with exertional hyooxemia. BAseline class 3 dyspnea AECOPD  - MAY/Jne 2011 - hospitalized  - Sept 2011 - Rx opd with abx/steroids  Past Surgical History: Reviewed history from 05/18/2010 and no changes required. hysterectomy 69 tummy tuck 80's throat surgery 80's x 2 cateract surgery 04/2010 breast biopsy  Past Pulmonary History:  Pulmonary History: admite AECOPD - May/June 2011  Family History: Reviewed history from 05/18/2010 and no changes required. Father-CHF Son-hyperlipedemia  Social History: Reviewed history from 05/18/2010 and no changes required. Patient states former smoker.  Quit 2008.  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  The patient denies shortness of breath with activity, shortness of breath at rest, productive cough, non-productive cough, coughing up blood, chest pain, irregular heartbeats, acid heartburn, indigestion, loss of appetite, weight change, abdominal pain, difficulty swallowing, tooth/dental problems, headaches,  nasal congestion/difficulty breathing through nose, sneezing, itching, ear ache, anxiety, depression, hand/feet swelling, joint stiffness or pain, rash, change in color of mucus, and fever.    Vital Signs:  Patient profile:   77 year old female Height:      60 inches Weight:      176.25 pounds BMI:     34.55 O2 Sat:      90 % on 2 L/min Temp:     98.0 degrees F oral Pulse rate:   73 / minute BP sitting:   122 / 78  (right arm) Cuff size:   regular  Vitals Entered By: Carron Curie CMA (October 07, 2010 3:42 PM)  O2 Flow:  2 L/min CC: Pt here for follow-up. Pt c/o runny nose, and sore throat and hoarseness. Comments Medications reviewed with patient Carron Curie CMA  October 07, 2010 3:46 PM Daytime phone number verified with patient.    Physical Exam  General:  well developed, well nourished, in no acute distress Head:  normocephalic and atraumatic Eyes:  PERRLA/EOM intact; conjunctiva and sclera clear Ears:  TMs intact and clear with normal canals Nose:  no deformity, discharge, inflammation, or lesions Mouth:  no deformity or lesions no thrush Neck:  no masses, thyromegaly, or abnormal cervical nodes Chest Wall:  kyphosis.   Lungs:  decreased BS bilateral and coarse BS throughout. no wheeze no distress baseline purse lip breathing +  Heart:  regular rate and rhythm, S1, S2 without murmurs, rubs, gallops, or clicks Abdomen:  bowel sounds positive; abdomen soft and non-tender without masses, or organomegaly Msk:  no deformity or scoliosis noted with normal posture Pulses:  pulses normal Extremities:  no clubbing, cyanosis, edema, or deformity noted Neurologic:  CN II-XII grossly intact with normal reflexes, coordination, muscle strength and tone Skin:  intact without lesions or rashes Cervical Nodes:  no significant adenopathy Axillary Nodes:  no significant adenopathy Psych:  alert and cooperative; normal mood and affect; normal attention span and  concentration   MISC. Report  Procedure date:  08/20/2010  Findings:      alpha 1 is MM genotype  MISC. Report  Procedure date:  08/18/2010  Findings:      outside labs hgb 13gm% creat 0.76mg %  Impression & Recommendations:  Problem # 1:  C O P D WITH ACUTE EXACERBATION (ICD-491.21) Assessment Deteriorated  recurent AECOPD  plan  - you are in an attack of COPD - have xopenex/ neb in office - IM depot medrol 80mg  in office - omnicef 300mg  poi two times a day x 6 days - 12 day prednisone taper - take spiriva 1 puff daily - take pulmicort and brovana neb daily - START ROFLUMILAST 1 tablet daily  - caution with going crazy or having diarrhea  Orders: Prescription Created Electronically [phone]) Est. Patient Level V (60454)  Problem # 2:  OTHER DISEASES OF NASAL CAVITY AND SINUSES (ICD-478.19) Assessment: Deteriorated  plan netti pot  Orders: Prescription Created Electronically ([id number]) Est. Patient Level V (91478)  Problem # 3:  HYPERTENSION (ICD-401.9) Assessment: Improved  continue bystolic. IF she cannot afford it will change to bisoprolol Her updated medication list for  this problem includes:    Furosemide 40 Mg Tabs (Furosemide) .Bridget Fry... Take 1 tablet by mouth once a day    Bystolic 5 Mg Tabs (Nebivolol hcl) ..... One tablet daily  Orders: Prescription Created Electronically [phone]) Est. Patient Level V [phone])  Medications Added to Medication List This Visit: 1)  Albuterol Sulfate (2.5 Mg/51ml) 0.083% Nebu (Albuterol sulfate) .... One vial as needed 2)  Daliresp 500 Mcg Tabs (Roflumilast) .... Take once daily, oral 3)  Prednisone 10 Mg Tabs (Prednisone) .... Take 4 tablets daily x3 days, then 3 tablets daily x 3 days, then 2 tablets daily x3 days, then 1 tablet daily x3 days, then stop 4)  Doxycycline Monohydrate 100 Mg Caps (Doxycycline monohydrate) .... By mouth twice daily after meals 5)  Bystolic 5 Mg Tabs (Nebivolol hcl) .... One tablet daily 6)   Cefdinir 300 Mg Caps (Cefdinir) .Bridget Fry.. 1 cap two times a day x 6 days. canel doxycyline  Patient Instructions: 1)  #COPD 2)   - you are in an attack of COPD 3)  - have xopenex/ neb in office 4)  - IM depot medrol 80mg  in office 5)  - omnicef 300mg  by mouth two times a day x 6 days 6)  - 12 day prednisone taper 7)  - take spiriva 1 puff daily 8)  - take pulmicort and brovana neb daily 9)  - START ROFLUMILAST 1 tablet daily 10)   - caution with going crazy or having diarrhea 11)  #SINUS 12)   - do netti pot regularly 13)  #BP 14)   -continue bystolic - take sample 15)   - [facility]  - if expensive call us 16)  #RETRUN 17)   - 2-3 weeks to report progress Prescriptions: CEFDINIR 300 MG CAPS (CEFDINIR) 1 cap two times a day x 6 days. CANEL DOXYCYLINE  #12 x 0   Entered and Authorized by:   Kalman Shan MD   Signed by:   Kalman Shan MD on 10/07/2010   Method used:   Electronically to        [facility]  [phone]* (retail)       [address]       [place], [place]  [address]       Ph: [phone] or [phone]       Fax: [phone]   RxID:   [id number] ALBUTEROL SULFATE (2.5 MG/3ML) 0.083% NEBU (ALBUTEROL SULFATE) one vial as needed  #60 x 3   Entered and Authorized by:   Kalman Shan MD   Signed by:   Kalman Shan MD on 10/07/2010   Method used:   Electronically to        [facility]  [phone]* (retail)       [address]       [place], [place]  [address]       Ph: [phone] or [phone]       Fax: [phone]   RxID:   [id number] BROVANA 15 MCG/2ML NEBU (ARFORMOTEROL TARTRATE) two times a day  #60 x 6   Entered and Authorized by:   Kalman Shan MD   Signed by:   Kalman Shan MD on 10/07/2010   Method used:   Electronically to        [facility]  [phone]* (retail)       [phone]  [place], [place]   [address]       Ph: [phone] or [phone]       Fax: [phone]   RxID:   [id number] SPIRIVA HANDIHALER 18 MCG CAPS (TIOTROPIUM BROMIDE MONOHYDRATE) once daily  #1 x 6   Entered and Authorized by:   Kalman Shan MD   Signed by:   Kalman Shan MD on 10/07/2010   Method used:   Electronically to        [facility]  [phone]* (retail)       [address]       [place], [place]  [address]       Ph: [phone] or [phone]       Fax: [phone]   RxID:   [id number] PULMICORT 0.25 MG/2ML SUSP (BUDESONIDE) two times a day  #60 x 6   Entered and Authorized by:   Kalman Shan MD   Signed by:   Kalman Shan MD on 10/07/2010   Method used:   Electronically to        [facility]  [phone]* (retail)       [address]       [place], [place]  [address]       Ph: [phone] or [phone]       Fax: [phone]   RxID:   [id number] BYSTOLIC 5 MG  TABS (NEBIVOLOL HCL) One tablet daily  #30 x 3   Entered and Authorized by:   Kalman Shan MD   Signed by:   Kalman Shan MD on 10/07/2010   Method used:   Electronically to        [facility]  [phone]* (retail)       [address]       [place], [place]  [address]       Ph: [phone] or [phone]       Fax: [phone]   RxID:   [id number] DOXYCYCLINE MONOHYDRATE 100 MG  CAPS (DOXYCYCLINE MONOHYDRATE) By mouth twice daily after meals  #10 x 0   Entered and Authorized by:   Kalman Shan MD   Signed by:   Kalman Shan MD on 10/07/2010   Method used:   Electronically to        [facility]  [phone]* (retail)       [address]       [place], [place]  [address]       Ph: [phone] or [phone]       Fax: [phone]   RxID:   [id number] PREDNISONE 10 MG  TABS (PREDNISONE) Take 4 tablets daily x3 days, then 3 tablets daily x 3  days, then 2 tablets daily x3 days, then 1 tablet daily x3 days, then stop  #30 x 0   Entered and Authorized by:   Kalman Shan MD   Signed by:   Kalman Shan MD on 10/07/2010   Method used:   Electronically to        [facility]  [phone]* (retail)       [address]       [place], [place]  [address]       Ph: [phone] or [phone]  Fax: [phone]   RxID:   [id number] DALIRESP 500 MCG TABS (ROFLUMILAST) take once daily, oral  #30 x 3   Entered and Authorized by:   Kalman Shan MD   Signed by:   Kalman Shan MD on 10/07/2010   Method used:   Electronically to        [facility]  [phone]* (retail)       [address]       [place], [place]  [address]       Ph: [phone] or [phone]       Fax: [phone]   RxID:   [id number]   Appended Document: depo update    Clinical Lists Changes  Orders: Added new Service order of Admin of Therapeutic Inj  intramuscular or subcutaneous (27062) - Signed Added new Service order of Depo- Medrol 80mg  (J1040) - Signed       Medication Administration  Injection # 1:    Medication: Depo- Medrol 80mg     Diagnosis: C O P D WITH ACUTE EXACERBATION (ICD-491.21)    Route: IM    Site: LUOQ gluteus    Exp Date: 05/2013    Lot #: [id number]    Mfr: Pharmacia    Patient tolerated injection without complications    Given by: Carver Fila (October 07, 2010 5:04 PM)  Orders Added: 1)  Admin of Therapeutic Inj  intramuscular or subcutaneous [96372] 2)  Depo- Medrol 80mg  [J1040]  Appended Document: neb order    Clinical Lists Changes  Orders: Added new Service order of Nebulizer Tx (37628) - Signed       Medication Administration  Medication # 1:    Medication: Xopenex 0.63mg     Diagnosis: C O P D WITH ACUTE EXACERBATION (ICD-491.21)    Dose: 1 vial    Route: inhaled    Exp Date: 05-2011    Lot #: [id number]    Mfr:  sepracor    Patient tolerated medication without complications    Given by: Carron Curie CMA (October 07, 2010 5:10 PM)  Orders Added: 1)  Nebulizer Tx [id number]

## 2011-01-14 NOTE — Assessment & Plan Note (Signed)
Summary: hfu/ mbw   Visit Type:  Hospital Follow-up Copy to:  DR Burney Gauze Primary Provider/Referring Provider:  Dr Burney Gauze  CC:  HFU.  Pt states breathing and cough are better however state she is still having a prod cough with yellow mucus.  Denies wheezing and chest tightness.Marland Kitchen  History of Present Illness: OV 05/18/2010: 77 year old ex-smoker. Was admitted 05/06/2010 through 05/13/2010 and newly diagnosed as AECOPD and COPD. Discharged home on 05/13/2010 on O2 and pred taper. Very deconditioned at discharge; was set up with home health and home PT. Returns today for fu. Doing very well. Increased strength and incresed ambulation. Compliant with meds. No complaints. STates pred taper is for 30 days. Has some questions on possibility of mixing brovana and pulmicort nebs. NO other complaints. Desatureted to 87% after walking in the office.   Preventive Screening-Counseling & Management  Alcohol-Tobacco     Smoking Status: quit  Current Medications (verified): 1)  Tenormin 50 Mg Tabs (Atenolol) .... Take 1 Tablet By Mouth Once A Day 2)  Furosemide 40 Mg Tabs (Furosemide) .... Take 1 Tablet By Mouth Once A Day 3)  Prozac 20 Mg Caps (Fluoxetine Hcl) .... Take 1 Capsule By Mouth Two Times A Day 4)  Protonix 40 Mg Tbec (Pantoprazole Sodium) .... Take 1 Tablet By Mouth Once A Day 5)  Xanax 0.25 Mg Tabs (Alprazolam) .... Take 1 Tablet By Mouth Two Times A Day 6)  Klor-Con 10 10 Meq Cr-Tabs (Potassium Chloride) .... Take 1 Tablet By Mouth Once A Day 7)  Metformin Hcl 500 Mg Tabs (Metformin Hcl) .... Take 1 Tablet By Mouth Two Times A Day 8)  Amlodipine Besylate 5 Mg Tabs (Amlodipine Besylate) .... Take 1 Tablet By Mouth Once A Day 9)  Brovana 15 Mcg/19ml Nebu (Arformoterol Tartrate) .... Two Times A Day 10)  Pulmicort 0.25 Mg/31ml Susp (Budesonide) .... Two Times A Day 11)  Spiriva Handihaler 18 Mcg Caps (Tiotropium Bromide Monohydrate) .... Once Daily 12)  Prednisone 20 Mg Tabs (Prednisone) .... 2  Tabs Once Daily 13)  Mucinex Dm 30-600 Mg Xr12h-Tab (Dextromethorphan-Guaifenesin) .... Two Times A Day 14)  Senna 187 Mg Tabs (Senna) .... 2 Once Daily As Needed 15)  Cyanocobalamin 1000 Mcg/ml Soln (Cyanocobalamin) .... Once Monthly 16)  Calcium Carbonate 600 Mg Tabs (Calcium Carbonate) .... Take 1 Tablet By Mouth Two Times A Day 17)  Nasonex 50 Mcg/act Susp (Mometasone Furoate) .... Once Daily As Needed 18)  Oxygen .... 2l Cont 19)  Albuterol Sulfate (2.5 Mg/2ml) 0.083% Nebu (Albuterol Sulfate) .Marland Kitchen.. 1 Vial Qid  Allergies (verified): 1)  Tetracycline 2)  Codeine  Past History:  Family History: Last updated: 05/18/2010 Father-CHF Son-hyperlipedemia  Social History: Last updated: 05/18/2010 Patient states former smoker.  Quit 2008.  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 B12 DEFICIENCY (ICD-266.2) THROMBOCYTOPENIA, CHRONIC (ICD-287.5) GERD (ICD-530.81) HYPERTENSION (ICD-401.9) DEPRESSION (ICD-311) ANXIETY (ICD-300.00) COPD (ICD-496)  Past Surgical History: hysterectomy 69 tummy tuck 80's throat surgery 80's x 2 cateract surgery 04/2010 breast biopsy  Family History: Reviewed history and no changes required. Father-CHF Son-hyperlipedemia  Social History: Reviewed history and no changes required. Patient states former smoker.  Quit 2008.  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  The patient denies coughing up blood, irregular heartbeats,  indigestion, tooth/dental problems, sneezing, hand/feet swelling, joint stiffness or pain, change in color of mucus, and fever.    Vital Signs:  Patient profile:   77 year old female Height:      60 inches Weight:      161 pounds BMI:     31.56 O2 Sat:      93 % on 2 L/minpulsed Temp:     98.1 degrees F oral Pulse rate:   66 / minute BP sitting:   136 / 84  (left arm) Cuff size:   regular  Vitals Entered By: Gweneth Dimitri RN (May 18, 2010 2:50 PM)  O2 Flow:  2 L/minpulsed CC: HFU.  Pt states breathing and cough are better however state she is still having a prod cough with yellow mucus.  Denies wheezing, chest tightness. Comments Medications reviewed with patient Daytime contact number verified with patient. Gweneth Dimitri RN  May 18, 2010 2:51 PM    Physical Exam  General:  well developed, well nourished, in no acute distress Head:  normocephalic and atraumatic Eyes:  PERRLA/EOM intact; conjunctiva and sclera clear Ears:  TMs intact and clear with normal canals Nose:  no deformity, discharge, inflammation, or lesions Mouth:  no deformity or lesions Neck:  no masses, thyromegaly, or abnormal cervical nodes Chest Wall:  kyphosis.   Lungs:  decreased BS bilateral and coarse BS throughout.   Heart:  regular rate and rhythm, S1, S2 without murmurs, rubs, gallops, or clicks Abdomen:  bowel sounds positive; abdomen soft and non-tender without masses, or organomegaly Msk:  no deformity or scoliosis noted with normal posture Pulses:  pulses normal Extremities:  no clubbing, cyanosis, edema, or deformity noted Neurologic:  CN II-XII grossly intact with normal reflexes, coordination, muscle strength and tone Skin:  intact without lesions or rashes Cervical Nodes:  no significant adenopathy Axillary Nodes:  no significant adenopathy Psych:  alert and cooperative; normal mood and affect; normal attention span and concentration   Impression & Recommendations:  Problem # 1:  COPD  (ICD-496) Assessment Improved PLAN continue spiriva once daily continue brovana and pulmicort nebs two times a day take pred taper as I have directed now [facility] if you can mix up pulmicort and brovana we will walk you without oxygen today to see where o2 level is we will do office spirometry on you today to see how bad copd is i am referring you to outpatient pulmonary rehab -  continue with home healthcare and home pt you should get flu shot every fall have you had pneumovax - if not, will give it now have doxycycyline prescription in hand for copd attacks return in  3 months or sooner if worse call us too if you are worse check A1AT at next visit  Medications Added to Medication List This Visit: 1)  Prednisone 10 Mg Tabs (Prednisone) .... 2 tablets daily x3 days, then 1 tablet daily x3 days, then stop 2)  Albuterol Sulfate (2.5 Mg/4ml) 0.083% Nebu (Albuterol sulfate) .Marland Kitchen.. 1 vial qid 3)  Doxycycline Monohydrate 100 Mg Caps (Doxycycline monohydrate) .... By mouth twice daily after meals  Other Orders: Rehabilitation Referral (Rehab) Est. Patient Level III (16109)  Patient Instructions: 1)  take albuterol only  as needed 2)  continue spiriva once daily 3)  continue brovana and pulmicort nebs two times a day 4)  take pred taper as I have directed now 5)  [facility] if you can mix up pulmicort and brovana 6)  we will walk you without oxygen today to see where o2 level is 7)  we will do office spirometry on you today to see how bad copd is 8)  i am referring you to outpatient pulmonary rehab -  9)  continue with home healthcare and home pt 10)  you should get flu shot every fall 11)  have you had pneumovax - if not, will give it now 12)  have doxycycyline prescription in hand for copd attacks 13)  return in  3 months or sooner if worse 14)  call us too if you are worse Prescriptions: PREDNISONE 10 MG  TABS (PREDNISONE) 2 tablets daily x3 days, then 1  tablet daily x3 days, then stop  #9 x 0   Entered and Authorized by:   Kalman Shan MD   Signed by:   Kalman Shan MD on 05/18/2010   Method used:   Print then Give to Patient   RxID:   [id number] DOXYCYCLINE MONOHYDRATE 100 MG  CAPS (DOXYCYCLINE MONOHYDRATE) By mouth twice daily after meals  #10 x 0   Entered and Authorized by:   Kalman Shan MD   Signed by:   Kalman Shan MD on 05/18/2010   Method used:   Print then Give to Patient   RxID:   [id number]   Appended Document: hfu/ mbw    Clinical Lists Changes  Orders: Added new Service order of Pneumococcal Vaccine (84696) - Signed Added new Service order of Admin 1st Vaccine (29528) - Signed Added new Referral order of Rehabilitation Referral (Rehab) - Signed Observations: Added new observation of PNEUMOVAXVIS: 07/10/96 version given May 18, 2010. (05/18/2010 15:29) Added new observation of PNEUMOVAXLOT: 0130AA (05/18/2010 15:29) Added new observation of PNEUMOVAXEXP: 07/25/2011 (05/18/2010 15:29) Added new observation of PNEUMOVAXBY: Gweneth Dimitri RN (05/18/2010 15:29) Added new observation of PNEUMOVAXRTE: IM (05/18/2010 15:29) Added new observation of PNEUMOVAXDOS: 0.5 ml (05/18/2010 15:29) Added new observation of PNEUMOVAXMFR: Merck (05/18/2010 15:29) Added new observation of PNEUMOVAXSIT: left deltoid (05/18/2010 15:29) Added new observation of PNEUMOVAX: Pneumovax (05/18/2010 15:29) Added new observation of POST PEF %: 33.20 % (05/18/2010 15:29) Added new observation of PEFRPOST%L/S: 1.48 L/s (05/18/2010 15:29) Added new observation of FEV1/FVC%EXP: 79.40 % (05/18/2010 15:29) Added new observation of FEV1/FVC: 59.09 % (05/18/2010 15:29) Added new observation of FEV1 % EXP: 51.20 % (05/18/2010 15:29) Added new observation of FEV1: 0.85 L (05/18/2010 15:29) Added new observation of FVC % EXPECT: 64.20 % (05/18/2010 15:29) Added new observation of FVC: 1.44 L (05/18/2010 15:29) Added new  observation of PEF % EXPECT: 4.45 % (05/18/2010 15:29) Added new observation of FEV1/FVC%PR: 74.45 % (05/18/2010 15:29) Added new observation of FEV1 PREDICT: 1.66 L (05/18/2010 15:29) Added new observation of FVC PREDICTE: 2.24 L/min (05/18/2010 15:29) Added new observation of SPIRTESTTYPE: Unconfirmed  (05/18/2010 15:29) Added new observation of PFT DATE: 05/18/2010 3:32 PM  (05/18/2010 15:29)       CardioPerfect Spirometry  ID: [id number] Patient: Bridget Fry DOB: 1931-10-21 Age: 77 Years Old Sex: Female Race: White Height: 60 Weight: 161 Status: Unconfirmed Past Medical History:  B12 DEFICIENCY (ICD-266.2) THROMBOCYTOPENIA, CHRONIC (ICD-287.5) GERD (ICD-530.81) HYPERTENSION (ICD-401.9) DEPRESSION (ICD-311) ANXIETY (ICD-300.00) COPD (ICD-496)   Recorded: 05/18/2010 3:32 PM  Parameter  Measured Predicted %Predicted FVC  1.44        2.24        64.20 FEV1     0.85        1.66        51.20 FEV1%   59.09        74.45        79.40 PEF    1.48        4.45        33.20   Interpretation:   Immunizations Administered:  Pneumonia Vaccine:    Vaccine Type: Pneumovax    Site: left deltoid    Mfr: Merck    Dose: 0.5 ml    Route: IM    Given by: Gweneth Dimitri RN    Exp. Date: 07/25/2011    Lot #: [id number]    VIS given: 07/10/96 version given May 18, 2010.   Ambulatory Pulse Oximetry  Resting; HR_82____    02 Sat_90% RA____  Lap1 (185 feet)   HR_80____   02 Sat__87% RA___ Lap2 (185 feet)   HR_____   02 Sat_____    Lap3 (185 feet)   HR_____   02 Sat_____  ___Test Completed without Difficulty _x__Test Stopped due to: Pt o2 sat 87% RA after 1st lap.  Pt placed on 2L pulsed, o2 sat increased to 91% with pulse of 71. Gweneth Dimitri RN  May 18, 2010 4:07 PM

## 2011-01-14 NOTE — Miscellaneous (Signed)
Summary: MS & OT Order / [facility]  MS & OT Order / [facility]   Imported By: Lennie Odor 01/04/2011 11:59:06  _____________________________________________________________________  External Attachment:    Type:   Image     Comment:   External Document

## 2011-01-14 NOTE — Progress Notes (Signed)
Summary: samples of  Brovana  Phone Note Call from Patient Call back at Home Phone [phone]   Caller: Patient Call For: Ramaswamy Summary of Call: paient phoned she is completely out of her Rosalyn Gess and she called her pharmacy and they are also out until next week. She wants to know if we have any samples that she can get to last until the pharmacy receives their shipment. Patient can be reached [phone] Initial call taken by: Vedia Coffer,  January 01, 2011 12:01 PM  Follow-up for Phone Call        Pt informed that #20 samples of Brovana was left at front desk for pick up. Abigail Miyamoto RN  January 01, 2011 1:25 PM

## 2011-01-14 NOTE — Progress Notes (Signed)
Summary: another anitbiotic  Phone Note Call from Patient Call back at Home Phone [phone]   Caller: Patient Reason for Call: Talk to Nurse Summary of Call: Patient has finished her antibiotic and is still not feeling better.  Patient wanting to know if she needs another antibiotic before the weekend. Initial call taken by: Lehman Prom,  October 15, 2010 11:18 AM  Follow-up for Phone Call        called spoke with patient who states that she finished the cefdinir on Tuesday and will finish her pred taper on Saturday (given at 10.26.11 ov w/ MR).  still c/o chest congestion, dry cough, increased SOB, some sinus pressure/congestion with PND - no better with the previous treatments.  informed pt that an ov is needed but pt refused seeing another physician other than MR.  is requesting a zpak to get her through the weekend.  please advise, thanks!  ALLERGIES: tetracycline and codeine.  [facility]. Follow-up by: Boone Master CNA/MA,  October 15, 2010 11:54 AM  Additional Follow-up for Phone Call Additional follow up Details #1::        OK Z pak Additional Follow-up by: Waymon Budge MD,  October 15, 2010 12:54 PM    Additional Follow-up for Phone Call Additional follow up Details #2::    Spoke with pt and notified that rx was sent to pharm for zpack. Follow-up by: Vernie Murders,  October 15, 2010 1:18 PM  New/Updated Medications: ZITHROMAX Z-PAK 250 MG TABS (AZITHROMYCIN) take as directed Prescriptions: ZITHROMAX Z-PAK 250 MG TABS (AZITHROMYCIN) take as directed  #1 x 0   Entered by:   Vernie Murders   Authorized by:   Waymon Budge MD   Signed by:   Vernie Murders on 10/15/2010   Method used:   Electronically to        [facility]  [phone]* (retail)       [address]       [place], [place]  [address]       Ph: [phone] or [phone]       Fax: [phone]   RxID:   [id number]

## 2011-01-14 NOTE — Progress Notes (Signed)
Summary: doxycycline LMTCB x 1  Phone Note Call from Patient Call back at [phone]   Caller: Britta Mccreedy from [facility] Call For: clance Reason for Call: Talk to Nurse Summary of Call: pt is allergic to tetricyclines and you prescribe doxycycline for pt.  Do you want her to take this? Initial call taken by: Eugene Gavia,  May 27, 2010 1:11 PM  Follow-up for Phone Call        [facility] Gweneth Dimitri RN  May 27, 2010 1:53 PM  pt returned our call.  pt states she was given Doxycycline by MR on 05-18-2010 to take.  Pt states she is allergic to Tetracycline and was told by pharmacist not to take this med.  MR out of office today.  Will forward message to CY to address. Thanks.  Aundra Millet Reynolds LPN  May 28, 2010 12:12 PM    Additional Follow-up for Phone Call Additional follow up Details #1::        Change doxycycline to Zpak Additional Follow-up by: Waymon Budge MD,  May 28, 2010 12:16 PM    Additional Follow-up for Phone Call Additional follow up Details #2::    Spoke with pt and advised that we will change doxy to zpack.  Pt okay with this and rx was sent to [facility]. Follow-up by: Vernie Murders,  May 28, 2010 12:27 PM  New/Updated Medications: ZITHROMAX Z-PAK 250 MG TABS (AZITHROMYCIN) take as directed Prescriptions: ZITHROMAX Z-PAK 250 MG TABS (AZITHROMYCIN) take as directed  #1 x 0   Entered by:   Vernie Murders   Authorized by:   Barbaraann Share MD   Signed by:   Vernie Murders on 05/28/2010   Method used:   Electronically to        [facility]  [phone]* (retail)       [address]       [place], [place]  [address]       Ph: [phone] or [phone]       Fax: [phone]   RxID:   [id number]

## 2011-01-14 NOTE — Progress Notes (Signed)
Summary: refill on zpak  Phone Note Call from Patient Call back at Home Phone [phone]   Caller: Patient Call For: young Reason for Call: Refill Medication Summary of Call: Wants a refill on zithromax z-pak 250mg .//[facility] Initial call taken by: Darletta Moll,  October 28, 2010 10:38 AM  Follow-up for Phone Call        Pt had and appt today but cancelled it stating the weather was too bad for her to come out in. She is requesting a refill on ZAPK. Pt completed Cefdinir course given on 10/07/10 and pred taper. She called back on 10-15-10 and stated she was no better. advised pt she needed an appt but she refused so Dr. Maple Hudson gave Zpak. Pt states she felt better after the course of Zpak, but now has began to have hoarsness and chest congestion, dry cough x "few days" and is requestign a refill on Zpak. I advised pt she really needs to keep the appt for today but again she refuses.  Pt r/s appt to 11-11-10. I advised I will forward message to  MR to advise. Carron Curie CMA  October 28, 2010 10:49 AM   Additional Follow-up for Phone Call Additional follow up Details #1::        ok please give zpak. Does she want prednisone too ? Additional Follow-up by: Kalman Shan MD,  October 28, 2010 2:10 PM    Additional Follow-up for Phone Call Additional follow up Details #2::    Spoke with patient-she would like to have RX for Prednisone as well. Please advise how you would like to give Prednisone RX. Pt is aware that I will send once MR advises-no call needed to patient about this.Reynaldo Minium CMA  October 28, 2010 2:18 PM   Additional Follow-up for Phone Call Additional follow up Details #3:: Details for Additional Follow-up Action Taken: prednisone 40mg  by mouth daily x 3 days, then 30mg  by mouth daily x 3 days, then 20mg  by mouth daily x 2 days, then 10mg  by mouth daily x 2 days and stop. Please have her come and see me or TP next week., She is being excused befcuase  of weather today Additional Follow-up by: Kalman Shan MD,  October 28, 2010 2:52 PM  New/Updated Medications: PREDNISONE 20 MG TABS (PREDNISONE) take 2 by mouth x 3 days, 1 1/2 by mouth x 3 days, 1 x 2 days, 1/2 by mouth x 2 days then stop ZITHROMAX Z-PAK 250 MG TABS (AZITHROMYCIN) take as directed per package Prescriptions: ZITHROMAX Z-PAK 250 MG TABS (AZITHROMYCIN) take as directed per package  #1pak x 0   Entered by:   Reynaldo Minium CMA   Authorized by:   Kalman Shan MD   Signed by:   Reynaldo Minium CMA on 10/28/2010   Method used:   Electronically to        [facility]  [phone]* (retail)       [address]       [place], [place]  [address]       Ph: [phone] or [phone]       Fax: [phone]   RxID:   [id number] PREDNISONE 20 MG TABS (PREDNISONE) take 2 by mouth x 3 days, 1 1/2 by mouth x 3 days, 1 x 2 days, 1/2 by mouth x 2 days then stop  #14 x 0   Entered by:   Reynaldo Minium CMA   Authorized by:   Carmin Muskrat  Ramaswamy MD   Signed by:   Reynaldo Minium CMA on 10/28/2010   Method used:   Electronically to        [facility]  [phone]* (retail)       [address]       [place], [place]  [address]       Ph: [phone] or [phone]       Fax: [phone]   RxID:   [id number]    spoke with patient-states she is very thankful for MR giving the RX's and that she has appt on 11-11-10 with MR. Will come in then to be seen.Reynaldo Minium CMA  October 28, 2010 3:02 PM

## 2011-01-14 NOTE — Progress Notes (Signed)
Summary: samples  Phone Note Call from Patient Call back at Home Phone [phone]   Caller: Patient Call For: ramaswamy Summary of Call: Need samples of brovana , bystolic 5mg , and pulmicort 0.25mg . Initial call taken by: Darletta Moll,  November 12, 2010 10:30 AM  Follow-up for Phone Call        I called and verified with pt the samples needed. 1 box of Brovana 15mg  and 3 boxes Bystolic 5mg  left at front desk. Pt states she was given some samples at last OV that she will not use and have not open and was told it was ok to return same. Alcario Drought will drop off those samples and pick up the new ones. Zackery Barefoot CMA  November 12, 2010 11:17 AM

## 2011-01-28 NOTE — Miscellaneous (Signed)
Summary: Care Plans/[facility]  Care Plans/[facility]   Imported By: Sherian Rein 01/19/2011 07:25:52  _____________________________________________________________________  External Attachment:    Type:   Image     Comment:   External Document

## 2011-02-02 ENCOUNTER — Encounter (HOSPITAL_BASED_OUTPATIENT_CLINIC_OR_DEPARTMENT_OTHER): Payer: Medicare Other | Admitting: Hematology and Oncology

## 2011-02-02 DIAGNOSIS — E538 Deficiency of other specified B group vitamins: Secondary | ICD-10-CM

## 2011-02-03 ENCOUNTER — Ambulatory Visit: Payer: Self-pay | Admitting: Internal Medicine

## 2011-02-09 ENCOUNTER — Ambulatory Visit: Payer: Self-pay | Admitting: Internal Medicine

## 2011-02-24 ENCOUNTER — Encounter: Payer: Self-pay | Admitting: Internal Medicine

## 2011-02-24 ENCOUNTER — Ambulatory Visit (INDEPENDENT_AMBULATORY_CARE_PROVIDER_SITE_OTHER): Payer: Medicare Other | Admitting: Internal Medicine

## 2011-02-24 DIAGNOSIS — J441 Chronic obstructive pulmonary disease with (acute) exacerbation: Secondary | ICD-10-CM

## 2011-02-24 NOTE — Assessment & Plan Note (Signed)
She appears to be in mild - moderate AECOPD.  I have advised her the follwing  1)  you have a copd attack 2)  when you get sick you should call us soon and sooner 3)  you got a neb treatment in office 4)  take antibiotic 1 two times a day x 5 days 5)  take 12 day prednisone taper as directed 6)  see my nurse again in 1  week and bring all meds with you for med    Calendar (she is very confused about her meds) 7)  if you are not improving call us or come sooner (I have advised and educated her about prompt AECOPD recognition) 8)  bring your advanced directives at followup 9)  no need to take eyrthomycin daily but instead  take darilsep daily - take some samples and discount card

## 2011-02-24 NOTE — Progress Notes (Signed)
  Subjective:    Patient ID: Bridget Fry, female    DOB: 03-08-1931, 77 y.o.   MRN: [id number]  HPI    Review of Systems  Respiratory: Positive for cough.        Objective:   Physical Exam        Assessment & Plan:

## 2011-02-24 NOTE — Progress Notes (Signed)
Subjective:    Patient ID: Bridget Fry, female    DOB: 01-19-1931, 77 y.o.   MRN: [id number]  Cough This is a new problem. The current episode started 1 to 4 weeks ago. The problem has been gradually worsening. The cough is non-productive. Associated symptoms include myalgias, nasal congestion, rhinorrhea and shortness of breath. Pertinent negatives include no chest pain, chills, ear pain, fever, heartburn, hemoptysis, rash, sore throat, sweats, weight loss or wheezing. The symptoms are aggravated by nothing. She has tried nothing for the symptoms. The treatment provided no relief. Her past medical history is significant for COPD.   77 year old ex 80 pack smoker. 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 Recurrent exacerbator.  Office visit today 02/24/2011:  At last visit nov 2011 -  Inability to afford roflumilast Nov 2011.  So, I advised macrolide prophylaxis for recurrent aecopd.  Advised to get advanced directives and start macrolide prophylaxis Nov 2011. Today she states that she never started macrolide proph - she is confused that it was for prophylaxis. However, she is on darliesp and now able to afford it.  SHe however has made a living will and reportedly she is a no code blue. In addition, past 2 weeks increased cough and dyspnea. Cough is severe but dry. Never  Had cough like this. Asociated feeling of flutter in chest and chest tightness with cough. Worsening of class 3 dyspnea but still at class 3 level. Associated runny nose + and overall tiredness +. Denies chest pain, hemoptysis, edema, pnd, orthonea, nausea, vomit, diarrhea      Review of Systems  Constitutional: Positive for activity change. Negative for fever, chills, weight loss, diaphoresis, appetite change, fatigue and unexpected weight change.  HENT: Positive for congestion and rhinorrhea. Negative for ear pain, sore throat, facial swelling, sneezing,  neck pain, neck stiffness and ear discharge.   Eyes: Negative.   Respiratory: Positive for cough and shortness of breath. Negative for apnea, hemoptysis, choking, chest tightness and wheezing.   Cardiovascular: Negative.  Negative for chest pain.  Gastrointestinal: Negative.  Negative for heartburn.  Genitourinary: Negative.   Musculoskeletal: Positive for myalgias.  Skin: Negative.  Negative for rash.  Neurological: Negative.   Hematological: Negative.   Psychiatric/Behavioral: Negative.        Objective:   Physical Exam  Constitutional: She appears well-developed and well-nourished. No distress.       Obese.   HENT:  Head: Normocephalic.  Mouth/Throat: Oropharynx is clear and moist.  Eyes: Conjunctivae are normal. Pupils are equal, round, and reactive to light. Left eye exhibits no discharge. No scleral icterus.  Neck: Normal range of motion. Neck supple. No JVD present. No tracheal deviation present. No thyromegaly present.  Cardiovascular: Regular rhythm and intact distal pulses.  Exam reveals no gallop and no friction rub.   No murmur heard. Pulmonary/Chest: No stridor. No respiratory distress. She has no wheezes. She has no rales. She exhibits no tenderness.       Prolonged expiration Overall diminished  Abdominal: Soft. Bowel sounds are normal. She exhibits no distension and no mass. There is no tenderness. There is no rebound and no guarding.  Musculoskeletal: Normal range of motion.  Lymphadenopathy:    She has no cervical adenopathy.  Neurological: She is alert.  Skin: Skin is warm and dry. She is not diaphoretic.  Psychiatric: She has a normal mood and affect. Her behavior is normal. Judgment and thought  content normal.       Seems to easily forget prior instructions of care. Pleasant and cheerful          Assessment & Plan:

## 2011-03-01 LAB — POCT CARDIAC MARKERS
Myoglobin, poc: 91.6 ng/mL (ref 12–200)
Troponin i, poc: <0.05 ng/mL (ref 0.00–0.09)
Troponin i, poc: <0.05 ng/mL (ref 0.00–0.09)

## 2011-03-01 LAB — POCT I-STAT 3, ART BLOOD GAS (G3+)
Acid-Base Excess: 4 mmol/L — ABNORMAL HIGH (ref 0.0–2.0)
Bicarbonate: 29.7 mEq/L — ABNORMAL HIGH (ref 20.0–24.0)
TCO2: 31 mmol/L (ref 0–100)
pH, Arterial: 7.421 — ABNORMAL HIGH (ref 7.350–7.400)
pO2, Arterial: 70 mmHg — ABNORMAL LOW (ref 80.0–100.0)

## 2011-03-01 LAB — BASIC METABOLIC PANEL
CO2: 28 mEq/L (ref 19–32)
CO2: 29 mEq/L (ref 19–32)
Calcium: 9.9 mg/dL (ref 8.4–10.5)
Chloride: 101 mEq/L (ref 96–112)
Chloride: 99 mEq/L (ref 96–112)
Creatinine, Ser: 0.67 mg/dL (ref 0.4–1.2)
Creatinine, Ser: 0.74 mg/dL (ref 0.4–1.2)
Creatinine, Ser: 0.81 mg/dL (ref 0.4–1.2)
GFR calc Af Amer: >60 mL/min (ref >60)
GFR calc Af Amer: >60 mL/min (ref >60)
GFR calc Af Amer: >60 mL/min (ref >60)
GFR calc non Af Amer: >60 mL/min (ref >60)
GFR calc non Af Amer: >60 mL/min (ref >60)
Potassium: 4.4 mEq/L (ref 3.5–5.1)

## 2011-03-01 LAB — GLUCOSE, CAPILLARY
Glucose-Capillary: 111 mg/dL — ABNORMAL HIGH (ref 70–99)
Glucose-Capillary: 106 mg/dL — ABNORMAL HIGH (ref 70–99)
Glucose-Capillary: 108 mg/dL — ABNORMAL HIGH (ref 70–99)
Glucose-Capillary: 124 mg/dL — ABNORMAL HIGH (ref 70–99)
Glucose-Capillary: 127 mg/dL — ABNORMAL HIGH (ref 70–99)
Glucose-Capillary: 130 mg/dL — ABNORMAL HIGH (ref 70–99)
Glucose-Capillary: 148 mg/dL — ABNORMAL HIGH (ref 70–99)
Glucose-Capillary: 157 mg/dL — ABNORMAL HIGH (ref 70–99)
Glucose-Capillary: 163 mg/dL — ABNORMAL HIGH (ref 70–99)
Glucose-Capillary: 166 mg/dL — ABNORMAL HIGH (ref 70–99)
Glucose-Capillary: 179 mg/dL — ABNORMAL HIGH (ref 70–99)
Glucose-Capillary: 189 mg/dL — ABNORMAL HIGH (ref 70–99)
Glucose-Capillary: 193 mg/dL — ABNORMAL HIGH (ref 70–99)
Glucose-Capillary: 195 mg/dL — ABNORMAL HIGH (ref 70–99)
Glucose-Capillary: 208 mg/dL — ABNORMAL HIGH (ref 70–99)
Glucose-Capillary: 223 mg/dL — ABNORMAL HIGH (ref 70–99)
Glucose-Capillary: 99 mg/dL (ref 70–99)

## 2011-03-01 LAB — COMPREHENSIVE METABOLIC PANEL
ALT: 78 U/L — ABNORMAL HIGH (ref 0–35)
AST: 40 U/L — ABNORMAL HIGH (ref 0–37)
Albumin: 3.9 g/dL (ref 3.5–5.2)
Alkaline Phosphatase: 62 U/L (ref 39–117)
Potassium: 3.9 mEq/L (ref 3.5–5.1)
Sodium: 135 mEq/L (ref 135–145)
Total Protein: 7 g/dL (ref 6.0–8.3)

## 2011-03-01 LAB — CARDIAC PANEL(CRET KIN+CKTOT+MB+TROPI)
CK, MB: 3.6 ng/mL (ref 0.3–4.0)
CK, MB: 3.8 ng/mL (ref 0.3–4.0)
Relative Index: INVALID (ref 0.0–2.5)
Total CK: 102 U/L (ref 7–177)
Total CK: 99 U/L (ref 7–177)
Troponin I: 0.01 ng/mL (ref 0.00–0.06)
Troponin I: <0.01 ng/mL (ref 0.00–0.06)

## 2011-03-01 LAB — CBC
MCHC: 33.4 g/dL (ref 30.0–36.0)
MCHC: 34.3 g/dL (ref 30.0–36.0)
MCV: 83 fL (ref 78.0–100.0)
MCV: 83.8 fL (ref 78.0–100.0)
Platelets: 100 10*3/uL — ABNORMAL LOW (ref 150–400)
RBC: 5.14 MIL/uL — ABNORMAL HIGH (ref 3.87–5.11)
RBC: 5.49 MIL/uL — ABNORMAL HIGH (ref 3.87–5.11)
RDW: 15.7 % — ABNORMAL HIGH (ref 11.5–15.5)
RDW: 15.9 % — ABNORMAL HIGH (ref 11.5–15.5)
WBC: 7.9 10*3/uL (ref 4.0–10.5)

## 2011-03-01 LAB — CK TOTAL AND CKMB (NOT AT ARMC): Relative Index: INVALID (ref 0.0–2.5)

## 2011-03-01 LAB — DIFFERENTIAL
Basophils Absolute: 0 10*3/uL (ref 0.0–0.1)
Basophils Relative: 0 % (ref 0–1)
Eosinophils Absolute: 0.2 10*3/uL (ref 0.0–0.7)
Monocytes Relative: 8 % (ref 3–12)
Neutro Abs: 4.2 10*3/uL (ref 1.7–7.7)
Neutrophils Relative %: 75 % (ref 43–77)

## 2011-03-01 LAB — URINALYSIS, ROUTINE W REFLEX MICROSCOPIC
Bilirubin Urine: NEGATIVE
Glucose, UA: NEGATIVE mg/dL
Hgb urine dipstick: NEGATIVE
Specific Gravity, Urine: 1.005 (ref 1.005–1.030)

## 2011-03-01 LAB — URINE MICROSCOPIC-ADD ON

## 2011-03-01 LAB — ALKALINE PHOSPHATASE: Alkaline Phosphatase: 68 U/L (ref 39–117)

## 2011-03-01 LAB — BRAIN NATRIURETIC PEPTIDE: Pro B Natriuretic peptide (BNP): 65 pg/mL (ref 0.0–100.0)

## 2011-03-02 NOTE — Assessment & Plan Note (Signed)
Summary: per reminder call/mhh   Copy to:  DR Burney Gauze Primary Provider/Referring Provider:  Dr Burney Gauze  CC:  Pt c/o productive cough with green phlegm x 1.5 weeks. Marland Kitchen  History of Present Illness: see epic for note February 24, 2011   Preventive Screening-Counseling & Management  Alcohol-Tobacco     Smoking Status: quit     Packs/Day: 2.0     Year Started: 1968     Year Quit: 2008     Pack years: 69  Allergies: 1)  Tetracycline 2)  Codeine  Past Pulmonary History:  Pulmonary History: admite AECOPD - May/June 2011  Vital Signs:  Patient profile:   77 year old female Height:      60 inches Weight:      161.50 pounds O2 Sat:      88 % on Room air Pulse rate:   77 / minute Cuff size:   regular  Vitals Entered By: Carron Curie CMA (February 24, 2011 3:25 PM)  O2 Flow:  Room air CC: Pt c/o productive cough with green phlegm x 1.5 weeks.  Comments Medications reviewed with patient s.ign Daytime phone number verified with patient.     Medications Added to Medication List This Visit: 1)  Prednisone 10 Mg Tabs (Prednisone) .... Take  4 tablets daily x3 days, then 3 tablets daily x 3 days, then 2 tablets daily x3 days, then 1 tablet daily x3 days, then stop 2)  Cefdinir 300 Mg Caps (Cefdinir) .Marland Kitchen.. 1 cap two times a day  Other Orders: Nebulizer Tx ([address]) Est. Patient Level III (60454) Prescription Created Electronically [phone])  Patient Instructions: 1)  you have a copd attack 2)  when you get sick you should call us soon and sooner 3)  you got a neb treatment in office 4)  take antibiotic 1 two times a day x 5 days 5)  take 12 day prednisone taper as directed 6)  see my nurse again in 1  week and bring all meds with you for med    calendar 7)  if you are not improving call us or come sooner 8)  bring your advanced directives at followup 9)  no need to take eyrthomycin daily 10)  take darilsep daily - take some samples and discount  card Prescriptions: CEFDINIR 300 MG CAPS (CEFDINIR) 1 cap two times a day  #10 x 0   Entered and Authorized by:   Kalman Shan MD   Signed by:   Kalman Shan MD on 02/24/2011   Method used:   Electronically to        [facility]  [phone]* (retail)       [address]       [place], [place]  [address]       Ph: [phone] or [phone]       Fax: [phone]   RxID:   [id number] PREDNISONE 10 MG  TABS (PREDNISONE) Take  4 tablets daily x3 days, then 3 tablets daily x 3 days, then 2 tablets daily x3 days, then 1 tablet daily x3 days, then stop  #30 x 0   Entered and Authorized by:   Kalman Shan MD   Signed by:   Kalman Shan MD on 02/24/2011   Method used:   Electronically to        [facility]  [phone]* (retail)       [address]  [place], [place]  [address]       Ph: [phone] or [phone]       Fax: [phone]   RxID:   [id number]      Medication Administration  Medication # 1:    Medication: Xopenex 0.63mg     Diagnosis: C O P D WITH ACUTE EXACERBATION (ICD-491.21)    Dose: 1 vial    Route: inhaled    Exp Date: 05/14/2011    Lot #: [id number]    Mfr: sepracor    Patient tolerated medication without complications    Given by: Kandice Hams CMA (February 24, 2011 3:37 PM)  Orders Added: 1)  Nebulizer Tx [94640] 2)  Est. Patient Level III [27253] 3)  Prescription Created Electronically [phone]

## 2011-03-03 ENCOUNTER — Encounter (HOSPITAL_BASED_OUTPATIENT_CLINIC_OR_DEPARTMENT_OTHER): Payer: Medicare Other | Admitting: Hematology and Oncology

## 2011-03-03 ENCOUNTER — Other Ambulatory Visit: Payer: Self-pay | Admitting: Hematology and Oncology

## 2011-03-03 DIAGNOSIS — D696 Thrombocytopenia, unspecified: Secondary | ICD-10-CM

## 2011-03-03 DIAGNOSIS — E538 Deficiency of other specified B group vitamins: Secondary | ICD-10-CM

## 2011-03-03 LAB — CBC WITH DIFFERENTIAL/PLATELET
BASO%: 0.1 % (ref 0.0–2.0)
LYMPH%: 5.6 % — ABNORMAL LOW (ref 14.0–49.7)
MCH: 27.1 pg (ref 25.1–34.0)
MCHC: 33.4 g/dL (ref 31.5–36.0)
MCV: 81.2 fL (ref 79.5–101.0)
MONO%: 3.2 % (ref 0.0–14.0)
NEUT%: 89.2 % — ABNORMAL HIGH (ref 38.4–76.8)
Platelets: 98 10*3/uL — ABNORMAL LOW (ref 145–400)
RBC: 4.52 10*6/uL (ref 3.70–5.45)
WBC: 6.5 10*3/uL (ref 3.9–10.3)

## 2011-03-03 LAB — BASIC METABOLIC PANEL
Calcium: 10.1 mg/dL (ref 8.4–10.5)
Creatinine, Ser: 0.93 mg/dL (ref 0.40–1.20)
Sodium: 138 mEq/L (ref 135–145)

## 2011-03-03 LAB — VITAMIN B12: Vitamin B-12: 815 pg/mL (ref 211–911)

## 2011-03-04 ENCOUNTER — Telehealth: Payer: Self-pay | Admitting: Internal Medicine

## 2011-03-04 DIAGNOSIS — J441 Chronic obstructive pulmonary disease with (acute) exacerbation: Secondary | ICD-10-CM

## 2011-03-04 MED ORDER — PREDNISONE 5 MG PO TABS: 5.0000 mg | ORAL_TABLET | Freq: Every day | ORAL | Status: DC

## 2011-03-04 NOTE — Telephone Encounter (Signed)
Spoke with pt and she states she has finished abc on Monday and still has one day left of predisone. She states she has seen a huge improvement in her cough and the amount of mucus that she is coughing up has improved as well. She satets she still has a productive cough with mostly  white phlegm but sometimes it is yellow, still feels that she is wheezing as well. She wanted to know does she need another round of abx? Please advise.  Allergies  Allergen Reactions  . Codeine     REACTION: nausea  . Tetracycline     REACTION: rash

## 2011-03-04 NOTE — Telephone Encounter (Signed)
ATC pt at home #.  Line busy x 3.  WCB

## 2011-03-04 NOTE — Telephone Encounter (Signed)
Okay please have her not to give up daliresp. Difference will be known only at end of  1 year. It is not for symptom relief but to cut down number of attacks. She can start low dose prednisone 5mg  per day. I will see her back in 2 weeks

## 2011-03-04 NOTE — Telephone Encounter (Signed)
That is great. Is she at baseline cough  Yet ? She can try mucinex if she is not on it. Other option is to do daily prednisone 5mg  low dose (But lot of long term side effects) ?How is the daily darilesp working for her ?

## 2011-03-04 NOTE — Telephone Encounter (Signed)
Spoke with pt. She basically reported the opposite of what was stated in the prior msg.  She states that her cough "keeps on getting worse" and it definitely not back at basleline for her.  She states that she had been on mucinex, but was told by Dr. Eula Listen to d/c this due to frequent nose bleeds.  She states that she dose not feel that daliresp is helping at all.  She states that she may be willing to try lose dose pred if you think this would help.  Please advise thanks

## 2011-03-04 NOTE — Telephone Encounter (Signed)
Spoke with pt and advised of recs.  Rx for prednisone sent. Carron Curie, MA

## 2011-03-12 ENCOUNTER — Ambulatory Visit (INDEPENDENT_AMBULATORY_CARE_PROVIDER_SITE_OTHER): Payer: Medicare Other | Admitting: Adult Health

## 2011-03-12 ENCOUNTER — Encounter: Payer: Self-pay | Admitting: Adult Health

## 2011-03-12 ENCOUNTER — Ambulatory Visit (INDEPENDENT_AMBULATORY_CARE_PROVIDER_SITE_OTHER)
Admission: RE | Admit: 2011-03-12 | Discharge: 2011-03-12 | Disposition: A | Discharge status: Home / Self Care | Payer: Medicare Other | Source: Ambulatory Visit | Attending: Adult Health | Admitting: Adult Health

## 2011-03-12 VITALS — BP 124/66 | HR 77 | Temp 97.1°F | Ht 61.0 in | Wt 162.2 lb

## 2011-03-12 DIAGNOSIS — J441 Chronic obstructive pulmonary disease with (acute) exacerbation: Secondary | ICD-10-CM

## 2011-03-12 MED ORDER — BENZONATATE 200 MG PO CAPS: 200.0000 mg | ORAL_CAPSULE | Freq: Three times a day (TID) | ORAL | Status: DC | PRN

## 2011-03-12 NOTE — Progress Notes (Signed)
  Subjective:    Patient ID: Bridget Fry, female    DOB: 03-19-1931, 77 y.o.   MRN: [id number]  HPI 77  year old ex 80 pack smoker. 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 Recurrent exacerbator.   Office visit today 02/24/2011: At last visit nov 2011 - Inability to afford roflumilast Nov 2011. So, I advised macrolide prophylaxis for recurrent aecopd. Advised to get advanced directives and start macrolide prophylaxis Nov 2011. Today she states that she never started macrolide proph - she is confused that it was for prophylaxis. However, she is on darliesp and now able to afford it. SHe however has made a living will and reportedly she is a no code blue. In addition, past 2 weeks increased cough and dyspnea. Cough is severe but dry. Never Had cough like this. Asociated feeling of flutter in chest and chest tightness with cough. Worsening of class 3 dyspnea but still at class 3 level. Associated runny nose + and overall tiredness +.  Tx w/ Steroid taper and Ceftin.   03/12/2011 Follow Up OV and Med Review--Returns for follow up . She is improved from last ov but still has minimally productive cough. Has stopped mucinex dm due to nasal dryness. No hemoptysis or fever /discolored mucus. She was started on Prednisone 5mg  to see if this would help with cough after she finished her steroid taper. She did not see any improvement. No weight loss.  She is feeling better with decreased dsypnea.   We reviewed all her meds and organized them into a med calendar. She is on multiple herbal products "they really help" .     Review of Systems   Constitutional:   No  weight loss, night sweats,  Fevers, chills, fatigue, lassitude. HEENT:   No headaches,  Difficulty swallowing,  Tooth/dental problems,  Sore throat,                No sneezing, itching, ear ache, nasal congestion, post nasal drip,   CV:  No chest pain,  Orthopnea, PND,  swelling in lower extremities, anasarca, dizziness, palpitations  GI  No heartburn, indigestion, abdominal pain, nausea, vomiting,change in bowel habits, loss of appetite  Resp:  No coughing up of blood.  No change in color of mucus.  No wheezing.  No chest wall deformity  Skin: no rash or lesions.  GU: no dysuria, change in color of urine, no urgency or frequency.  No flank pain.  MS:  No joint pain or swelling.  No decreased range of motion.  No back pain.  Psych:  No change in mood or affect. No depression or anxiety.  No memory loss.     Objective:   Physical Exam Gen: Pleasant, well-nourished, in no distress,  normal affect  ENT: No lesions,  mouth clear,  oropharynx clear, no postnasal drip  Neck: No JVD, no TMG, no carotid bruits  Lungs: No use of accessory muscles, no dullness to percussion, clear without rales or rhonchi, diminished BS in bases   Cardiovascular: RRR, heart sounds normal, no murmur or gallops, tr  peripheral edema  Abdomen: soft and NT, no HSM,  BS normal, obese   Musculoskeletal: No deformities, no cyanosis or clubbing  Neuro: alert, non focal  Skin: Warm, no lesions or rashes      Assessment & Plan:

## 2011-03-12 NOTE — Patient Instructions (Addendum)
Decrease Prednisone 5mg  1/2 daily for 3 days and then stop Delsym 2 tsp Twice daily  As needed  For cough  Tessalon Three times a day  As needed  Cough .  Place all oil based meds in freezer.  follow up Dr. Marchelle Gearing 4-6 weeks and As needed   I will call with xray results.

## 2011-03-12 NOTE — Assessment & Plan Note (Addendum)
Recent AECOPD - slowly resolving. She is improved however has lingering cough  Will check xray . No significant improvement with prednisone -low dose  Will taper to off. Hold on further abx. At present time.   She has a very complex med regimen.  We reviewed all her meds and organized them into a med calendar  Plan:  Decrease Prednisone 5mg  1/2 daily for 3 days and then stop Delsym 2 tsp Twice daily  As needed  For cough  Tessalon Three times a day  As needed  Cough .  Place all oil based meds in freezer.  follow up Dr. Marchelle Gearing 4-6 weeks and As needed   I will call with xray results.

## 2011-03-23 ENCOUNTER — Telehealth: Payer: Self-pay | Admitting: Adult Health

## 2011-03-23 NOTE — Telephone Encounter (Signed)
Pt returned call from someone. Per tp- sending this back to triage. Bridget Fry

## 2011-03-23 NOTE — Telephone Encounter (Signed)
Spoke w/ pt and advised her of cxr results. Pt verbalized understanding. Pt states she still has a nasty cough w/ white phlem. Pt states she has been taking Delsym 2 tsp Twice daily and the Tessalon Three times a day. Pt also states she placed all her oil based vitamins in the freezer as well. Pt has an upcoming apt 5/11 w/ MR. Please advise Bridget Fry. Thanks  Allergies  Allergen Reactions  . Codeine     REACTION: nausea  . Tetracycline     REACTION: rash    Carver Fila, CMA

## 2011-03-23 NOTE — Telephone Encounter (Signed)
Add mucinex Twice daily   Cont on delsym and tessalon  CXR was ok  If not improving will need to see Dr. Marchelle Gearing  Please contact office for sooner follow up if symptoms do not improve or worsen or seek emergency care

## 2011-03-23 NOTE — Telephone Encounter (Signed)
TP, please advise on any recs for cough.

## 2011-03-23 NOTE — Telephone Encounter (Signed)
Called, spoke with pt.  She was informed of recs per TP and verbalized understanding.  She requsting cxr results.  I informed her of these results per TP:  Notes Recorded by Rubye Oaks, NP on 03/15/2011 at 2:16 PM Xray is ok, no signs of PNA  Cont w/ ov recs  follow up as planned and As needed      Pt requesting further information on this -- ? What exactly did the cxr show.  Advised will call her back tomorrow with this information.  TP, pls advise.  Thanks!

## 2011-03-24 NOTE — Telephone Encounter (Signed)
Spoke w/ pt and advised her of cxr results. Pt verbalized understanding and stated that was all she wanted to know was that it did not show any signs of infection or masses. Pt needed nothing else further

## 2011-03-24 NOTE — Telephone Encounter (Signed)
Xray showed hyperinflation that is common with COPD pt.  Lungs were clear - no sign of infection, masses/tumors, etc

## 2011-03-31 ENCOUNTER — Encounter (HOSPITAL_BASED_OUTPATIENT_CLINIC_OR_DEPARTMENT_OTHER): Payer: Medicare Other | Admitting: Hematology and Oncology

## 2011-03-31 DIAGNOSIS — E538 Deficiency of other specified B group vitamins: Secondary | ICD-10-CM

## 2011-04-02 ENCOUNTER — Telehealth: Payer: Self-pay | Admitting: Internal Medicine

## 2011-04-02 ENCOUNTER — Other Ambulatory Visit: Payer: Self-pay | Admitting: *Deleted

## 2011-04-02 DIAGNOSIS — J441 Chronic obstructive pulmonary disease with (acute) exacerbation: Secondary | ICD-10-CM

## 2011-04-02 MED ORDER — ALBUTEROL SULFATE HFA 108 (90 BASE) MCG/ACT IN AERS: 2.0000 | INHALATION_SPRAY | Freq: Four times a day (QID) | RESPIRATORY_TRACT | Status: DC | PRN

## 2011-04-02 MED ORDER — PREDNISONE 5 MG PO TABS: 5.0000 mg | ORAL_TABLET | Freq: Every day | ORAL | Status: DC

## 2011-04-02 NOTE — Telephone Encounter (Signed)
Spoke with pt.  She wants to have copies of her last 2 cxrs and ct chest to have for her records.  I advised will print and leave up front for her to pick up.

## 2011-04-02 NOTE — Telephone Encounter (Signed)
Pt wants rx for proair called in for her. Pt has had some increased SOB. Proair is not on pt med list. Pt also wants to know if she can restart the prednsione qd. At last OV w/ TP she had pt to stop the prednisone after 3 days of decreasing to 1/2 daily. Pt states she wants to restart to help her breathing get better. Please advise Dr. Marchelle Gearing if okay to do. Thanks  Allergies  Allergen Reactions  . Codeine     REACTION: nausea  . Tetracycline     REACTION: rash    Carver Fila, CMA

## 2011-04-02 NOTE — Telephone Encounter (Signed)
FEEL LIKE COMING OUT SHE WANTS TO KNOW IF THERE IS ANYTHING THAT DR RAMASWAMY CAN DO TO HELP HER GET THESE RECORDS SHE CAN BE REACHED AT [phone].Vedia Coffer

## 2011-04-02 NOTE — Telephone Encounter (Signed)
Addended by: Vernie Murders on: 04/02/2011 02:17 PM   Modules accepted: Orders

## 2011-04-02 NOTE — Telephone Encounter (Signed)
whyen patient saw TP prior to that I had told her to take 5mg  day maintenance because of bad copd and recurrent aecopd. But when she saw TP she told TP it was not helping so TP tapered it off. I think she should go back on prednisone 5mg  per day maintenance. She should only stop it after talking to me. She has albuterol neb prn, You can give her the same mdi prn

## 2011-04-02 NOTE — Telephone Encounter (Signed)
Spoke with pt and notified of the above recs per Dr. Maple Hudson.  Pt verbalized understanding.  Rxs sent to pharm for proair and prednisone.

## 2011-04-06 ENCOUNTER — Telehealth: Payer: Self-pay | Admitting: Internal Medicine

## 2011-04-06 DIAGNOSIS — J441 Chronic obstructive pulmonary disease with (acute) exacerbation: Secondary | ICD-10-CM

## 2011-04-06 NOTE — Telephone Encounter (Signed)
Pt wants to know if she should continue on Prednisone 5 mg daily or if she can wean herself off of this medication. Pt states she is feeling better. Will forward to MR for his recs.

## 2011-04-07 NOTE — Telephone Encounter (Signed)
She has been told that she should be on maintenance prednisone. She can try prednisone 5mg  alternate day but continue on this alternate day regimen

## 2011-04-07 NOTE — Telephone Encounter (Signed)
ATC # provided above - NA and unable to leave message.  WCB. 

## 2011-04-08 MED ORDER — PREDNISONE 5 MG PO TABS: ORAL_TABLET | ORAL | Status: DC

## 2011-04-08 NOTE — Telephone Encounter (Signed)
Pt aware to continue Prednisone 5 mg every other day and rx will be sent to [facility] on [address].

## 2011-04-23 ENCOUNTER — Encounter: Payer: Self-pay | Admitting: Internal Medicine

## 2011-04-23 ENCOUNTER — Ambulatory Visit (INDEPENDENT_AMBULATORY_CARE_PROVIDER_SITE_OTHER): Payer: Medicare Other | Admitting: Internal Medicine

## 2011-04-23 DIAGNOSIS — J441 Chronic obstructive pulmonary disease with (acute) exacerbation: Secondary | ICD-10-CM

## 2011-04-23 DIAGNOSIS — J449 Chronic obstructive pulmonary disease, unspecified: Secondary | ICD-10-CM

## 2011-04-23 MED ORDER — HYDROCODONE-HOMATROPINE 5-1.5 MG/5ML PO SYRP: 5.0000 mL | ORAL_SOLUTION | Freq: Four times a day (QID) | ORAL | Status: DC | PRN

## 2011-04-23 MED ORDER — PREDNISONE 1 MG PO TABS: 1.0000 mg | ORAL_TABLET | Freq: Every day | ORAL | Status: DC

## 2011-04-23 MED ORDER — BENZONATATE 200 MG PO CAPS: 200.0000 mg | ORAL_CAPSULE | Freq: Three times a day (TID) | ORAL | Status: DC | PRN

## 2011-04-23 NOTE — Progress Notes (Signed)
  Subjective:    Patient ID: Bridget Fry, female    DOB: 1931/03/27, 77 y.o.   MRN: [id number]  HPI 77 year old with advanced COPD. This is a followup visit. Since last visit, we placed her on daily prednisone due to recurrent AECOPD and diabling dyspnea. However, she at baseline lives alone, has no social support and is constantly confused about her medication regimen. Numerous phone calls and need to clarify and reclarify. In any event, numerous complaints from her this visit but in essence it appears that baseline dyspnea has deteriorated. She borders on class 3-4 dyspnea. Feels fatigued. Difficulaty with ADLs. Using albuterol a lot; having tremors. Having some intolerance to prednisone in terms of edema but otherwise no problems. No fever or worsening cough or sputum.    Review of Systems  Constitutional: Positive for activity change, appetite change and unexpected weight change. Negative for fever.  HENT: Positive for nosebleeds, congestion, rhinorrhea, dental problem and postnasal drip. Negative for ear pain, sore throat, sneezing, trouble swallowing and sinus pressure.   Eyes: Positive for itching. Negative for redness.  Respiratory: Positive for cough and shortness of breath. Negative for chest tightness and wheezing.   Cardiovascular: Positive for palpitations. Negative for chest pain and leg swelling.  Gastrointestinal: Positive for diarrhea. Negative for nausea and vomiting.  Genitourinary: Positive for urgency. Negative for dysuria.  Musculoskeletal: Positive for joint swelling.  Skin: Negative for rash.  Neurological: Positive for headaches.  Hematological: Bruises/bleeds easily.  Psychiatric/Behavioral: Positive for dysphoric mood. The patient is nervous/anxious.        Objective:   Physical Exam    General:  well developed, well nourished, in no acute distress Head:  normocephalic and atraumatic Eyes:  PERRLA/EOM intact; conjunctiva and sclera clear Ears:  TMs intact and  clear with normal canals Nose:  no deformity, discharge, inflammation, or lesions Mouth:  no deformity or lesions no thrush Neck:  no masses, thyromegaly, or abnormal cervical nodes Chest Wall:  kyphosis.   Lungs:  decreased BS bilateral and coarse BS throughout. no wheeze no distress baseline purse lip breathing +  Heart:  regular rate and rhythm, S1, S2 without murmurs, rubs, gallops, or clicks Abdomen:  bowel sounds positive; abdomen soft and non-tender without masses, or organomegaly Msk:  no deformity or scoliosis noted with normal posture Pulses:  pulses normal Extremities:  no clubbing, cyanosis, edema, or deformity noted Neurologic:  CN II-XII grossly intact with normal reflexes, coordination, muscle strength and tone Skin:  intact without lesions or rashes Cervical Nodes:  no significant adenopathy Axillary Nodes:  no significant adenopathy Psych:  alert and cooperative; normal mood and affect; normal attention span and concentration      Assessment & Plan:

## 2011-04-23 NOTE — Patient Instructions (Addendum)
I am concerned your copd is advancing and medicines are at maximum benefit Cut your prednisone down to 1mg  every day Continue all other medications; you can take albuterol sample if we have it For cough, I have sent tessalon perles and hyrocodone cough syrup Come next week and have breathing test - if this is worse, I will see if we can set you up with hospice at home - you will find this helpful for your life quality I think your tremor is because of too much albuterol use; please cut use down REturn next month for followup and discussion

## 2011-04-25 ENCOUNTER — Encounter: Payer: Self-pay | Admitting: Internal Medicine

## 2011-04-26 ENCOUNTER — Telehealth: Payer: Self-pay | Admitting: Internal Medicine

## 2011-04-26 NOTE — Assessment & Plan Note (Signed)
It seems that neither oral prednisone and or roflumilast are helping cut down exacerbations or symptoms. SHe lives alone. Very poor social support. Very poor understanding of medications. For her dyspnea relief is the most important.   PLAN We discussed for 20 minutes face to face I am concerned your copd is advancing and medicines are at maximum benefit Cut your prednisone down to 1mg  every day Continue all other medications; you can take albuterol sample if we have it For cough, I have sent tessalon perles and hyrocodone cough syrup Come next week and have breathing test - if this is worse, I will see if we can set you up with hospice at home - you will find this helpful for your life quality and cost.  I think your tremor is because of too much albuterol use; please cut use down REturn next month for followup and discussion

## 2011-04-26 NOTE — Telephone Encounter (Signed)
Spoke with pt.  She wanted to verify prednisone directions- I advised should be taking 1 mg daily.  Pt verbalized understanding. She also asked for samples of daliresp, and these were left up front for pick up.

## 2011-04-28 ENCOUNTER — Encounter (HOSPITAL_BASED_OUTPATIENT_CLINIC_OR_DEPARTMENT_OTHER): Payer: Medicare Other | Admitting: Hematology and Oncology

## 2011-04-28 DIAGNOSIS — E538 Deficiency of other specified B group vitamins: Secondary | ICD-10-CM

## 2011-04-30 NOTE — Op Note (Signed)
[facility]  Patient:    Bridget Fry, Bridget Fry                        MRN: [id number] Proc. Date: 03/21/00 Adm. Date:  [id number] Attending:  Brynda Peon CC:         Edwena Felty. Ashley Royalty, M.D.                           Operative Report  PREOPERATIVE DIAGNOSIS:       Large vaginal mass.  POSTOPERATIVE DIAGNOSIS:      Large vaginal mass, path pending.  PROCEDURE:                    Excision of vaginal mass.  SURGEON:                      Cynthia P. Ashley Royalty, M.D.  ANESTHESIA:                   Modified anesthesia care and local anesthesia.  ESTIMATED BLOOD LOSS:         Minimal.  COMPLICATIONS:                None.  PROCEDURE:                    The patient was taken to the operating room and after the induction of IV sedation by anesthesia was placed in the dorsal lithotomy position.  A coated speculum was inserted into the vagina and the apex was visualized.  From the apex there extended a 3 cm long vaginal polyp that extended from a base that was approximately 2 cm wide.  It was fleshy, red, and smooth. It was identified and grasped with the pickup.  A 20 x 12 loop was used to excise his mass at the base and the cautery ball was used for hemostasis.  After removal of the mass there was a second much smaller mass noted on the left vaginal side wall. It was approximately 5 mm in diameter.  It was elevated and also removed with the loop and cautery ball used for hemostasis.  The patient tolerated it well and the procedure was terminated.  She went to the recovery room in satisfactory condition.  The specimens went to pathology separately. DD:  03/21/00 TD:  03/21/00 Job: [id number]

## 2011-05-07 ENCOUNTER — Ambulatory Visit (INDEPENDENT_AMBULATORY_CARE_PROVIDER_SITE_OTHER): Payer: Medicare Other | Admitting: Internal Medicine

## 2011-05-07 DIAGNOSIS — J449 Chronic obstructive pulmonary disease, unspecified: Secondary | ICD-10-CM

## 2011-05-07 LAB — PULMONARY FUNCTION TEST

## 2011-05-07 NOTE — Progress Notes (Signed)
PFT done today. 

## 2011-05-24 ENCOUNTER — Encounter: Payer: Self-pay | Admitting: Internal Medicine

## 2011-05-26 ENCOUNTER — Encounter (HOSPITAL_BASED_OUTPATIENT_CLINIC_OR_DEPARTMENT_OTHER): Payer: Medicare Other | Admitting: Hematology and Oncology

## 2011-05-26 DIAGNOSIS — E538 Deficiency of other specified B group vitamins: Secondary | ICD-10-CM

## 2011-06-02 ENCOUNTER — Ambulatory Visit: Payer: Medicare Other | Admitting: Internal Medicine

## 2011-06-04 ENCOUNTER — Ambulatory Visit: Payer: Medicare Other | Admitting: Internal Medicine

## 2011-06-04 ENCOUNTER — Telehealth: Payer: Self-pay | Admitting: Internal Medicine

## 2011-06-04 NOTE — Telephone Encounter (Signed)
Called and spoke with pt.  Pt is requesting pft results done 05/07/11.  Please advise. Thank you.

## 2011-06-04 NOTE — Telephone Encounter (Signed)
Spoke with pt and notified of results.  Pt verbalized understanding. She states that she had to cancel appt due to transportation issues and wcb to resched.

## 2011-06-04 NOTE — Telephone Encounter (Signed)
Moderate copd - severe copd. In between the two. She is supposed to come in today for ov that is why I did not call her back with results

## 2011-06-14 ENCOUNTER — Telehealth: Payer: Self-pay | Admitting: Internal Medicine

## 2011-06-14 MED ORDER — CEFDINIR 300 MG PO CAPS: 300.0000 mg | ORAL_CAPSULE | Freq: Two times a day (BID) | ORAL | Status: AC

## 2011-06-14 NOTE — Telephone Encounter (Signed)
Ok for [employer] for ceftin for 5 days; same dose as last time

## 2011-06-14 NOTE — Telephone Encounter (Signed)
Spoke with pt and notified of recs per MR. Pt verbalized understanding. Rx was sent to [facility].

## 2011-06-14 NOTE — Telephone Encounter (Signed)
Pt c/o nasal congestion and dry cough x 3 days. She wants to know if she can take Nasonex along with her other medications and is requesting another RX for Ceftin for her congestion.. She is also hoarse.. She denies any sob, wheezing, fever or sore throat. Pt uses [facility] on [address]. Pls advise. Allergies  Allergen Reactions  . Codeine     REACTION: nausea  . Tetracycline     REACTION: rash

## 2011-06-21 ENCOUNTER — Telehealth: Payer: Self-pay | Admitting: Internal Medicine

## 2011-06-21 NOTE — Telephone Encounter (Signed)
I spoke with the Bridget Fry and advised that she can use the albuterol every 4-6 hours as needed.  She is c/o increased SOB, nasal congestion and swelling in her feet x 3 days.   I advised the Bridget Fry that she needs an appt. Bridget Fry states she cannot come in until Friday afternoon I advised the Bridget Fry that she should consider coming in sooner due to her symptoms, but she again states she cannot come due to transportation issue. Bridget Fry set to see Dr. Sherene Sires Friday at 3:15. Bridget Fry advised to go to ER is symptoms worsen.  Carron Curie, CMA

## 2011-06-23 ENCOUNTER — Telehealth: Payer: Self-pay | Admitting: Internal Medicine

## 2011-06-23 NOTE — Telephone Encounter (Signed)
Spoke with pt's caregiver, Alcario Drought. She states that pt is having increased SOB and anxiety x 2 days. I advised needs appt asap for eval and offered ov with TP for tommorrw, she refused this due to transportation issues. I have scheduled her to see MW on 06/25/11 at 3:15 pm, and advised that if symptoms persist or worsen, she needs to go to ED or UC asap. Erica verbalized understanding and states will inform the pt of this.

## 2011-06-25 ENCOUNTER — Ambulatory Visit (INDEPENDENT_AMBULATORY_CARE_PROVIDER_SITE_OTHER): Payer: Medicare Other | Admitting: Internal Medicine

## 2011-06-25 ENCOUNTER — Encounter: Payer: Self-pay | Admitting: Internal Medicine

## 2011-06-25 VITALS — BP 140/80 | HR 91 | Temp 97.9°F | Ht 61.0 in | Wt 152.0 lb

## 2011-06-25 DIAGNOSIS — J9610 Chronic respiratory failure, unspecified whether with hypoxia or hypercapnia: Secondary | ICD-10-CM

## 2011-06-25 DIAGNOSIS — R0609 Other forms of dyspnea: Secondary | ICD-10-CM

## 2011-06-25 DIAGNOSIS — J449 Chronic obstructive pulmonary disease, unspecified: Secondary | ICD-10-CM

## 2011-06-25 DIAGNOSIS — R0600 Dyspnea, unspecified: Secondary | ICD-10-CM | POA: Insufficient documentation

## 2011-06-25 MED ORDER — FUROSEMIDE 40 MG PO TABS: ORAL_TABLET | ORAL | Status: AC

## 2011-06-25 NOTE — Progress Notes (Signed)
  Subjective:    Patient ID: Bridget Fry, female    DOB: 09-16-1931, 77 y.o.   MRN: [id number]  HPI  77 year old with advanced COPD. This is a followup visit. Since last visit, we placed her on daily prednisone due to recurrent AECOPD and diabling dyspnea. However, she at baseline lives alone, has no social support and is constantly confused about her medication regimen. Numerous phone calls and need to clarify and reclarify. In any event, numerous complaints from her this visit but in essence it appears that baseline dyspnea has deteriorated. She borders on class 3-4 dyspnea. Feels fatigued.   06/25/2011 ov/Tanicka Bisaillon cc worse sob and leg swelling x 5 days on 02 2.5 lpm 24 hours per day stopped bystolic and daliresp and reduced lasix to one half prior to worsening symptoms due to nausea and weakness.  No purulent sputum. Sob occurs bed > chair movement even on 02. No better p saba.  Pt denies any significant sore throat, dysphagia, itching, sneezing,  nasal congestion or excess/ purulent secretions,  fever, chills, sweats, unintended wt loss, pleuritic or exertional cp, hempoptysis, orthopnea pnd or leg swelling.    Also denies any obvious fluctuation of symptoms with weather or environmental changes or other aggravating or alleviating factors.           Objective:   Physical Exam    General:  Chronically ill, wheelchair bound wf talking in full sentences Head:  normocephalic and atraumatic Eyes:  PERRLA/EOM intact; conjunctiva and sclera clear Ears:  TMs intact and clear with normal canals Nose:  no deformity, discharge, inflammation, or lesions Mouth:  no deformity or lesions no thrush Neck:  no masses, thyromegaly, or abnormal cervical nodes Chest Wall:  kyphosis.   Lungs:  decreased BS bilateral and coarse BS throughout. no wheeze no distress baseline purse lip breathing +  Heart:  regular rate and rhythm, S1, S2 without murmurs, rubs, gallops, or clicks Abdomen:  bowel sounds  positive; abdomen soft and non-tender without masses, or organomegaly Msk:  no deformity or scoliosis noted with normal posture Pulses:  pulses normal Extremities:  no clubbing, cyanosis, edema, or deformity noted Skin:  intact without lesions or rashes       Assessment & Plan:

## 2011-06-25 NOTE — Patient Instructions (Signed)
Please see patient coordinator before you leave today  to schedule for humdified 02  Increase Lasix to one whole pill per day and take two potassium tablets with it.

## 2011-06-26 ENCOUNTER — Encounter: Payer: Self-pay | Admitting: Internal Medicine

## 2011-06-26 DIAGNOSIS — J9610 Chronic respiratory failure, unspecified whether with hypoxia or hypercapnia: Secondary | ICD-10-CM | POA: Insufficient documentation

## 2011-06-26 NOTE — Assessment & Plan Note (Signed)
Ambulated here on 2lpm today without limiting sob or desat x one half lap

## 2011-06-26 NOTE — Assessment & Plan Note (Addendum)
I had an extended discussion with the patient today lasting 15 to 20 minutes of a 25 minute visit on the following issues:  Symptoms are markedly disproportionate to objective findings and not clear this is a lung problem but pt does appear to have difficult airway management issues.  DDX of  difficult airways managment all start with A and  include Adherence, Ace Inhibitors, Acid Reflux, Active Sinus Disease, Alpha 1 Antitripsin deficiency, Anxiety masquerading as Airways dz,  ABPA,  allergy(esp in young), Aspiration (esp in elderly), Adverse effects of DPI,  Active smokers, plus two Bs  = Bronchiectasis and Beta blocker use..and one C= CHF   Anxiety and chf major concerns here but the main issue is Adherence, a multilayered concern that requires a "trust but verify" approach in every patient - starting with knowing how to use medications, especially inhalers, correctly, keeping up with refills and understanding the fundamental difference between maintenance and prns vs those medications only taken for a very short course and then stopped and not refilled.   See instructions for specific recommendations which were reviewed directly with the patient who was given a copy with highlighter outlining the key components.

## 2011-07-21 ENCOUNTER — Encounter: Payer: Self-pay | Admitting: Internal Medicine

## 2011-07-21 ENCOUNTER — Encounter (HOSPITAL_BASED_OUTPATIENT_CLINIC_OR_DEPARTMENT_OTHER): Payer: Medicare Other | Admitting: Hematology and Oncology

## 2011-07-21 ENCOUNTER — Ambulatory Visit (INDEPENDENT_AMBULATORY_CARE_PROVIDER_SITE_OTHER): Payer: Medicare Other | Admitting: Internal Medicine

## 2011-07-21 VITALS — BP 140/80 | HR 75 | Temp 97.0°F | Ht 61.0 in | Wt 147.0 lb

## 2011-07-21 DIAGNOSIS — J449 Chronic obstructive pulmonary disease, unspecified: Secondary | ICD-10-CM

## 2011-07-21 DIAGNOSIS — E538 Deficiency of other specified B group vitamins: Secondary | ICD-10-CM

## 2011-07-21 MED ORDER — PREDNISONE 1 MG PO TABS: 1.0000 mg | ORAL_TABLET | Freq: Every day | ORAL | Status: DC

## 2011-07-21 NOTE — Progress Notes (Signed)
Subjective:    Patient ID: Bridget Fry, female    DOB: Dec 27, 1930, 77 y.o.   MRN: [id number]  HPI Advanced COPD NOS   - nasal cannula o, pred 1mg  daily, budesonide nebs, brovana neb, spiriva, and alb prn  - class 3-4 disabling dyspnea  - Recurrrent AECOPD - did not tolerate darilesp early 2012 - tremors with albuterol - poor social support: lives alone, confused about medications  OV 07/20/2011: Routine followup for above.After  Last visit in april we got pft for hospice eligibility. Fev1 05/07/11 showed 0.94L/61% mderate copd; somewhat surprising and I learned today that machine was malfunctional that day. In terms of symptoms, class 3 dyspnea persists but somewhat better after recent steroid burst that ended today. Rates dyspnea as moderate- severe. Worsened with exertion. Relieved by rest and nebs. Review of past hx : reveals hx of edema after prednisone, saw Dr Sherene Sires and lasix doubled and edema resolved. She has new mole in left shin and skin bx pending. No change in social or family situation thought she is interested in hospice and apparently has left living will with Dr Eula Listen expressing DNR/DNI.      Review of Systems  Constitutional: Negative for fever and unexpected weight change.  HENT: Negative for ear pain, nosebleeds, congestion, sore throat, rhinorrhea, sneezing, trouble swallowing, dental problem, postnasal drip and sinus pressure.   Eyes: Negative for redness and itching.  Respiratory: Negative for cough, chest tightness, shortness of breath and wheezing.   Cardiovascular: Negative for palpitations and leg swelling.  Gastrointestinal: Negative for nausea and vomiting.  Genitourinary: Negative for dysuria.  Musculoskeletal: Negative for joint swelling.  Skin: Negative for rash.  Neurological: Negative for headaches.  Hematological: Does not bruise/bleed easily.  Psychiatric/Behavioral: Negative for dysphoric mood. The patient is not nervous/anxious.        Objective:   Physical Exam  Vitals reviewed. Constitutional: She is oriented to person, place, and time. She appears well-developed and well-nourished. No distress.       overweight  HENT:  Head: Normocephalic and atraumatic.  Right Ear: External ear normal.  Left Ear: External ear normal.  Mouth/Throat: Oropharynx is clear and moist. No oropharyngeal exudate.  Eyes: Conjunctivae and EOM are normal. Pupils are equal, round, and reactive to light. Right eye exhibits no discharge. Left eye exhibits no discharge. No scleral icterus.  Neck: Normal range of motion. Neck supple. No JVD present. No tracheal deviation present. No thyromegaly present.  Cardiovascular: Normal rate, regular rhythm, normal heart sounds and intact distal pulses.  Exam reveals no gallop and no friction rub.   No murmur heard. Pulmonary/Chest: Effort normal. No respiratory distress. She has no wheezes. She has no rales. She exhibits no tenderness.       Prolonged expiration  Abdominal: Soft. Bowel sounds are normal. She exhibits no distension and no mass. There is no tenderness. There is no rebound and no guarding.  Musculoskeletal: Normal range of motion. She exhibits no edema and no tenderness.  Lymphadenopathy:    She has no cervical adenopathy.  Neurological: She is alert and oriented to person, place, and time. She has normal reflexes. No cranial nerve deficit. She exhibits normal muscle tone. Coordination normal.  Skin: Skin is warm and dry. No rash noted. She is not diaphoretic. No erythema. No pallor.  Psychiatric: She has a normal mood and affect. Her behavior is normal. Judgment and thought content normal.       Excited, anxious  Assessment & Plan:

## 2011-07-21 NOTE — Patient Instructions (Addendum)
Currently copd is stable Continue your current medications We will do hospice referral  Have flu shot in fall rEturn in 3 months or sooner if there are problems Do office spirometry at followup Check alpha 1 next visit if not already done

## 2011-07-21 NOTE — Assessment & Plan Note (Signed)
curently stable  Plan Continue current meds I think clas 3-4  dyspnea with hypoxemia could classify for home hospice which she is interested; explained hospice concept PFT May 2012 surproisingly only shows gold stage 2 copd but I think is machine error; will repeat spiro next visit Check alpha 1 next visit if not already done

## 2011-07-26 ENCOUNTER — Telehealth: Payer: Self-pay | Admitting: Internal Medicine

## 2011-07-26 ENCOUNTER — Encounter: Payer: Self-pay | Admitting: Internal Medicine

## 2011-07-26 NOTE — Telephone Encounter (Signed)
Spoke with patient-states she was just seen on 07-21-11 and didn't understand why she needed to come back-I told patient that per MR last OV if patient was having problems she needed to come in and be seen-hence why RB suggested being seen or go to ED. Also, pt stated she will just call Dr Ceasar Mons office. Nothing further needed per patient.

## 2011-07-26 NOTE — Telephone Encounter (Signed)
Pt c/o non-productive cough, wheezing, chills, worse starting last night. Pt denies fever, chest tightness, changes in breathing. Currently not taking any antibiotics but is requesting same. Is currently on Prednisone 1 mg once daily. Please advise. Thanks.   Allergies  Allergen Reactions  . Codeine     REACTION: nausea  . Tetracycline     REACTION: rash

## 2011-07-26 NOTE — Telephone Encounter (Signed)
She needs to be seen in office, or if she worsens then go to ED.

## 2011-08-03 ENCOUNTER — Telehealth: Payer: Self-pay | Admitting: Internal Medicine

## 2011-08-03 NOTE — Telephone Encounter (Signed)
Per dr Kriste Basque increase xanax to 0.25mg  4 times a day as needed, for sob the hospice nurse feels is related to anxiety and hold the bystolic for now and continue to monitor bp. Call back for dr Marchelle Gearing if symptoms do not improve or if they worsen--pam with hospice aware of recs. And feels this is what is needed

## 2011-08-03 NOTE — Telephone Encounter (Signed)
Spoke with Pam pt's Hospice RN. She states that pt is having several issues. 1- increased anxiety- she takes xanax 0.25 bid prn and Pam thinks this needs adjusting. 2- increased SOB- ? Anxiety related? Sats okay with o2 and resp 32-36 today. She is on 1 mg pred daily 3- low BP- she is currently taking Bystolic 5 mg daily and Pam recs that we should hold this for now.  Will forward this to doc of the day in MR's absence. Please advise, thanks! Allergies  Allergen Reactions  . Codeine     REACTION: nausea  . Tetracycline     REACTION: rash

## 2011-08-05 ENCOUNTER — Telehealth: Payer: Self-pay | Admitting: Internal Medicine

## 2011-08-05 MED ORDER — AZITHROMYCIN 250 MG PO TABS: ORAL_TABLET | ORAL | Status: AC

## 2011-08-05 MED ORDER — PREDNISONE 10 MG PO TABS: ORAL_TABLET | ORAL | Status: DC

## 2011-08-05 NOTE — Telephone Encounter (Signed)
Spoke with pt hospice nurse and she states the pt is having increased SOB, faint rales in LLL, left rib pain, dry cough and hoarseness as well. The pt is on prednisone 1mg  per her PCP, but Bridget Fry is asking if she can have a prednisone burst and an rx for antibiotics? Please advise. Bridget Fry, CMA Allergies  Allergen Reactions  . Codeine     REACTION: nausea  . Tetracycline     REACTION: rash

## 2011-08-05 NOTE — Telephone Encounter (Signed)
Per CY-okay to give Prednisone 10mg  #20 take 4 x 2 days, 3 x 2 days, 2 x 2 days, 1 x 2 days then stop. Also, give Zpak #1 take as directed no refills.    Pt aware of Rx's sent.

## 2011-09-15 ENCOUNTER — Other Ambulatory Visit: Payer: Self-pay | Admitting: Hematology and Oncology

## 2011-09-15 ENCOUNTER — Encounter (HOSPITAL_BASED_OUTPATIENT_CLINIC_OR_DEPARTMENT_OTHER): Payer: Medicare Other | Admitting: Hematology and Oncology

## 2011-09-15 DIAGNOSIS — E538 Deficiency of other specified B group vitamins: Secondary | ICD-10-CM

## 2011-09-15 DIAGNOSIS — D696 Thrombocytopenia, unspecified: Secondary | ICD-10-CM

## 2011-09-15 LAB — CBC WITH DIFFERENTIAL/PLATELET
Basophils Absolute: 0 10*3/uL (ref 0.0–0.1)
EOS%: 4.2 % (ref 0.0–7.0)
Eosinophils Absolute: 0.1 10*3/uL (ref 0.0–0.5)
HGB: 12.5 g/dL (ref 11.6–15.9)
MCH: 25 pg — ABNORMAL LOW (ref 25.1–34.0)
MONO#: 0.3 10*3/uL (ref 0.1–0.9)
NEUT#: 2.4 10*3/uL (ref 1.5–6.5)
RDW: 17.3 % — ABNORMAL HIGH (ref 11.2–14.5)
WBC: 3.3 10*3/uL — ABNORMAL LOW (ref 3.9–10.3)
lymph#: 0.5 10*3/uL — ABNORMAL LOW (ref 0.9–3.3)

## 2011-09-15 LAB — BASIC METABOLIC PANEL
Glucose, Bld: 253 mg/dL — ABNORMAL HIGH (ref 70–99)
Potassium: 4.4 mEq/L (ref 3.5–5.3)
Sodium: 136 mEq/L (ref 135–145)

## 2011-09-15 LAB — VITAMIN B12: Vitamin B-12: 727 pg/mL (ref 211–911)

## 2011-10-12 ENCOUNTER — Telehealth: Payer: Self-pay | Admitting: Internal Medicine

## 2011-10-12 NOTE — Telephone Encounter (Signed)
Pt c/o bright red throat with blister like bumps & scratchy, dry cough, nasal congestion, body aches, headaches, x 2 1/2 weeks. Has an appointment with MR on Friday 11/2. Is taking medications as directed. Last abx was August. Has not been taking Tessalon 200 mg but states she will start today, is also taking Hydromet w/o "much" relief, and Prednisone 1mg  daily.   I advised pt that MR is out of the office today and will return on tomorrow, pt request to wait on MR's advise. Pt states if a prescription is called in to send to [facility] on [address] and advise it is for a Hospice pt. MR please advise. Thanks.

## 2011-10-13 ENCOUNTER — Other Ambulatory Visit: Payer: Self-pay | Admitting: Hematology and Oncology

## 2011-10-13 ENCOUNTER — Encounter (HOSPITAL_BASED_OUTPATIENT_CLINIC_OR_DEPARTMENT_OTHER): Admitting: Hematology and Oncology

## 2011-10-13 DIAGNOSIS — D696 Thrombocytopenia, unspecified: Secondary | ICD-10-CM

## 2011-10-13 DIAGNOSIS — E538 Deficiency of other specified B group vitamins: Secondary | ICD-10-CM

## 2011-10-13 LAB — CBC WITH DIFFERENTIAL/PLATELET
Basophils Absolute: 0 10*3/uL (ref 0.0–0.1)
Eosinophils Absolute: 0.1 10*3/uL (ref 0.0–0.5)
HGB: 12.5 g/dL (ref 11.6–15.9)
MONO#: 0.4 10*3/uL (ref 0.1–0.9)
NEUT#: 2.6 10*3/uL (ref 1.5–6.5)
RDW: 17.9 % — ABNORMAL HIGH (ref 11.2–14.5)
WBC: 3.7 10*3/uL — ABNORMAL LOW (ref 3.9–10.3)
lymph#: 0.5 10*3/uL — ABNORMAL LOW (ref 0.9–3.3)

## 2011-10-13 MED ORDER — AZITHROMYCIN 250 MG PO TABS: ORAL_TABLET | ORAL | Status: AC

## 2011-10-13 NOTE — Telephone Encounter (Signed)
Pt is requesting to have a refill put on her zpak. Please advise MR if this is okay, thanks  Bridget Fry, CMA

## 2011-10-13 NOTE — Telephone Encounter (Signed)
I spoke with pt and notified of recs per MR. She states that she is unable to tolerate doxycycline "or any other cycline". She states that she would like to have 2 zpacks called in b.c this normally helps most. MR, pls advise thanks!

## 2011-10-13 NOTE — Telephone Encounter (Signed)
I spoke with Cordelia Pen from hospice and she is aware of MR recs. She verbalized understanding and had no questions

## 2011-10-13 NOTE — Telephone Encounter (Signed)
Ok send zpak

## 2011-10-13 NOTE — Telephone Encounter (Signed)
Give 2 refills  

## 2011-10-13 NOTE — Telephone Encounter (Signed)
I spoke with Bridget Fry and she is aware rx was sent x 2 refills and will p/u later this afternoon.I called [phone] for Bridget Fry hospice nurse but  I had to LM on triage nurse line to call back

## 2011-10-13 NOTE — Telephone Encounter (Signed)
Take prednisone 40 mg daily x 2 days, then 20mg  daily x 2 days, then 10mg  daily x 2 days, then 5mg  daily x 2 days and then 1 mg daily to continue Take doxycycline 100mg  po twice daily x 5 days; take after meals and avoid sunlight Coordinate symptoms with hospice too- have her [facility] hospice  She is having aECOPD I think

## 2011-10-15 ENCOUNTER — Ambulatory Visit (INDEPENDENT_AMBULATORY_CARE_PROVIDER_SITE_OTHER): Payer: Medicare Other | Admitting: Internal Medicine

## 2011-10-15 ENCOUNTER — Encounter: Payer: Self-pay | Admitting: Internal Medicine

## 2011-10-15 VITALS — BP 138/72 | HR 83 | Temp 97.8°F | Ht 61.0 in | Wt 150.8 lb

## 2011-10-15 DIAGNOSIS — J449 Chronic obstructive pulmonary disease, unspecified: Secondary | ICD-10-CM

## 2011-10-15 DIAGNOSIS — J441 Chronic obstructive pulmonary disease with (acute) exacerbation: Secondary | ICD-10-CM

## 2011-10-15 MED ORDER — LEVALBUTEROL HCL 0.63 MG/3ML IN NEBU: 1.0000 | INHALATION_SOLUTION | Freq: Four times a day (QID) | RESPIRATORY_TRACT | Status: DC | PRN

## 2011-10-15 MED ORDER — LEVALBUTEROL HCL 0.63 MG/3ML IN NEBU
0.6300 mg | INHALATION_SOLUTION | Freq: Once | RESPIRATORY_TRACT | Status: AC
  Administered 2011-10-15: 0.63 mg via RESPIRATORY_TRACT

## 2011-10-15 MED ORDER — PREDNISONE 10 MG PO TABS: ORAL_TABLET | ORAL | Status: DC

## 2011-10-15 NOTE — Progress Notes (Signed)
Subjective:    Patient ID: Bridget Fry, female    DOB: 01-09-1931, 77 y.o.   MRN: [id number]  HPI Advanced COPD , DNAR, DNAI - Fev1 10/15/11:  0.91L/52%    - nasal cannula o2, pred 1mg  daily, budesonide nebs, brovana neb, spiriva, and alb prn  - class 3-4 disabling dyspnea  - Recurrrent AECOPD - did not tolerate darilesp early 2012 - tremors and tachycardia with albuterol; changed to xopenex nov 2012 - poor social support: lives alone, confused about medications - echo May 2011: EF 65%, gr 1 diast dysfun and mildly elevated PASP and c/w cor pulmonale - in home hospice since august 2012   OV 10/15/11: Followup COPD. On home hospice. Last visit 07/20/11 when hospice benefit was started. She finds the hospice benefit beneficial. Past week increased dyspnea, cough and white sputum. Called in zpak and she is day 3 of it. For unclear reasons the prednisone did not go through and she is not on it. She is no better. Still with class 3-4 dyspnea. Also c.o tachycardia with albuterol; refusing albuterol. Wants alternative. Wondering if has heart issues - echo shows cor pulmonale from 2011. No other changes .No chest pain.  Pulse ox on room air at rest: 88%. Spirometry today shows Fev1: 0.91L/52%  - gold stage 3 severe copd  Past, Social, Family:  Was given morophine for dyspnea by hospice (in setting of hycodan) and she developed delirum. Otherwise, no change.       Review of Systems  Constitutional: Negative for fever and unexpected weight change.  HENT: Negative for ear pain, nosebleeds, congestion, sore throat, rhinorrhea, sneezing, trouble swallowing, dental problem, postnasal drip and sinus pressure.   Eyes: Negative for redness and itching.  Respiratory: Positive for cough and shortness of breath. Negative for chest tightness and wheezing.   Cardiovascular: Positive for palpitations. Negative for leg swelling.  Gastrointestinal: Negative for nausea and vomiting.  Genitourinary:  Negative for dysuria.  Musculoskeletal: Negative for joint swelling.  Skin: Negative for rash.  Neurological: Negative for headaches.  Hematological: Does not bruise/bleed easily.  Psychiatric/Behavioral: Negative for dysphoric mood. The patient is not nervous/anxious.        Objective:   Physical Exam Vitals reviewed. Constitutional: She is oriented to person, place, and time. She appears well-developed and well-nourished. No distress.       overweight  HENT:  Head: Normocephalic and atraumatic.  Right Ear: External ear normal.  Left Ear: External ear normal.  Mouth/Throat: Oropharynx is clear and moist. No oropharyngeal exudate.  Eyes: Conjunctivae and EOM are normal. Pupils are equal, round, and reactive to light. Right eye exhibits no discharge. Left eye exhibits no discharge. No scleral icterus.  Neck: Normal range of motion. Neck supple. No JVD present. No tracheal deviation present. No thyromegaly present.  Cardiovascular: Normal rate, regular rhythm, normal heart sounds and intact distal pulses.  Exam reveals no gallop and no friction rub.   No murmur heard. Pulmonary/Chest: Effort normal. No respiratory distress. She has no wheezes. She has no rales. She exhibits no tenderness.       Prolonged expiration and VISIBLY DYSPNEIC AS ALWAYS Abdominal: Soft. Bowel sounds are normal. She exhibits no distension and no mass. There is no tenderness. There is no rebound and no guarding.  Musculoskeletal: Normal range of motion. She exhibits no edema and no tenderness.  Lymphadenopathy:    She has no cervical adenopathy.  Neurological: She is alert and oriented to person, place, and time. She has normal reflexes.  No cranial nerve deficit. She exhibits normal muscle tone. Coordination normal.  Skin: Skin is warm and dry. No rash noted. She is not diaphoretic. No erythema. No pallor.  Psychiatric: She has a normal mood and affect. Her behavior is normal. Judgment and thought content normal.         Excited, anxious as before           Assessment & Plan:

## 2011-10-15 NOTE — Assessment & Plan Note (Signed)
Nurse will ensure that your prednisone will be called in Finish zpak as before Stop albuterol due to tachycardia Have hospice do xopenex inhaler for you - take script Your echo from 2011 shows cor pulmonale that is pressure effect of right heart due to bad copd. I do not see any other need to do further heart workup at this stage unless symptoms change I think based on your disease severity you still qualify for hospice: gold stage 3 copd, hypoxemic 88% pulse ox room air at rest, class 4 dyspnea, and cor pulmonale on echo

## 2011-10-15 NOTE — Patient Instructions (Addendum)
Nurse will ensure that your prednisone will be called in Finish zpak as before Stop albuterol due to tachycardia Have hospice do xopenex inhaler for you - take script Your echo from 2011 shows cor pulmonale that is pressure effect of right heart due to bad copd. I do not see any other need to do further heart workup at this stage unless symptoms change I think based on your disease severity you still qualify for hospice ROV 3 months: alpha 1 check at followup

## 2011-10-15 NOTE — Assessment & Plan Note (Signed)
Currently on ZPAK. Yet to get prednisone. So she is not better.  PLAN - start prednisone burst - finish zpak

## 2011-10-18 ENCOUNTER — Telehealth: Payer: Self-pay | Admitting: Internal Medicine

## 2011-10-18 MED ORDER — HYDROCODONE-HOMATROPINE 5-1.5 MG/5ML PO SYRP: 5.0000 mL | ORAL_SOLUTION | Freq: Four times a day (QID) | ORAL | Status: DC | PRN

## 2011-10-18 MED ORDER — BENZONATATE 200 MG PO CAPS: 200.0000 mg | ORAL_CAPSULE | Freq: Three times a day (TID) | ORAL | Status: DC | PRN

## 2011-10-18 NOTE — Telephone Encounter (Signed)
Called and spoke with pt. She states that pharmacy never received rxs for tessalon, xopenex neb sol, and hycodan. She states needs these called to pharm asap. Per JC, we faxed the rx for xopenex already. I called and spoke with pharmacist and gave verbal order to refill hycodan and tessalon and confirmed with pharmacist that they did in fact receive the xopenex rx and it is ready for her to pick up. Pt aware.

## 2011-10-19 ENCOUNTER — Other Ambulatory Visit: Payer: Self-pay | Admitting: Internal Medicine

## 2011-10-27 ENCOUNTER — Telehealth: Payer: Self-pay | Admitting: Internal Medicine

## 2011-10-27 NOTE — Telephone Encounter (Signed)
LMTCB

## 2011-10-28 NOTE — Telephone Encounter (Signed)
LMOM informing Bridget Fry of Cy's response and recs and to call back if anything further needed.

## 2011-10-28 NOTE — Telephone Encounter (Signed)
Bridget Fry returning call says she did receive CY's message she can be reached at [phone].Bridget Fry

## 2011-10-28 NOTE — Telephone Encounter (Signed)
Hospice nurse lois borg requests a call back asap this am. [phone]. Bridget Fry

## 2011-10-28 NOTE — Telephone Encounter (Signed)
LMOM for [place] Lions to call back

## 2011-10-28 NOTE — Telephone Encounter (Signed)
I called Borg, RN and LMOM- suggest patient give Hospice a key. If she is too drowsy to answer door- it is possible CO2 retention, but she doesn't need anxiolytic or sedative she has declined previously anyway. Consider [employer].

## 2011-10-28 NOTE — Telephone Encounter (Signed)
Hospice nurse,[name], called back.  She states yesterday hospice aid went to pt's home to give her a bath.  Hospice aid called [place] Lions stating pt was "loopy and falling asleep in chair."  [place] Lions states she called pt and said she would come out to evaluate her.  Once she got to the home, [place] Lions states pt wouldn't answer the door and the phone was off the hook.  Ambulance was called and they were able to get pt to answer the door.  [place] Lions states pt stated she feel alseep with phone in hand but had no recollection of the phone conversation she had with [place] Lions about [place] Lions coming over to check on pt.     Lions states all pt's vital signs are normal, although heart is irregular.  Pt is still having increased sob anxiety but refuses anxiety meds (xanax and ativan)  And is also refusing Xopenex (stating she doesn't like how it makes her feel) and is only taking a very small amount of hycodan.   Lions is concerned about pt's increased sob and anxiety and wants to keep pt as comfortable as possible.  Pt is requesting MD's recs.  MR not in office today.  Will forward message to doc of the day to address.   Allergies  Allergen Reactions  . Albuterol Sulfate     Increased heart rate  . Codeine     REACTION: nausea  . Tetracycline     REACTION: rash

## 2011-10-28 NOTE — Telephone Encounter (Signed)
Called and spoke with [place].  She stated she was with a pt and would have to call me back.  Will wait for her to call back.

## 2011-10-29 ENCOUNTER — Telehealth: Payer: Self-pay | Admitting: Internal Medicine

## 2011-10-29 NOTE — Telephone Encounter (Signed)
Bridget Fry  Please call patient and ask if our copd initiative team Delana Meyer and Adelene Idler could call her and ask questions about copd and its meaning to her. They need 15 minutes or so of her time. We are trying to optimize copd care in [place] and across [place]. Let me know what she says  Thanks  MR

## 2011-11-05 ENCOUNTER — Telehealth: Payer: Self-pay | Admitting: Internal Medicine

## 2011-11-05 MED ORDER — LEVALBUTEROL TARTRATE 45 MCG/ACT IN AERO: 2.0000 | INHALATION_SPRAY | RESPIRATORY_TRACT | Status: DC | PRN

## 2011-11-05 MED ORDER — PREDNISONE 10 MG PO TABS: ORAL_TABLET | ORAL | Status: DC

## 2011-11-05 NOTE — Telephone Encounter (Signed)
OK to increase to 20 mg pred x 1 week, then 10 mg daily OK for xopenex MDI

## 2011-11-05 NOTE — Telephone Encounter (Signed)
LM with [place] Lions, hospice nurse, giving her detailed information of RA's response and recs.  And to call back if she has any questions or concerns regarding patient.

## 2011-11-05 NOTE — Telephone Encounter (Signed)
I spoke with the pt and she states this would be fine with her for the COPD team to contact her. Carron Curie, CMA

## 2011-11-05 NOTE — Telephone Encounter (Signed)
I spoke with the pt and has is requesting a refill on xopenex inhaler. I then spoke to the pt hospice nurse [place] Lions and she states the pt is experiencing increased SOB, more labored breathing, and increased anxiety due to this. She states they have the pt on valium which helps the anxiety but the pt is still SOB. The pt is currently on 1 mg of prednisone and [place] Lions wants to know can this be increased to see if it helps the pt SOB. At last OV the pt was given a pred taper and she seemed to do better on the higher dose of prednisone. She states over the last several days there has been a noticeable decline in the pt breathing.  Please advise. Bridget Fry, CMA Allergies  Allergen Reactions  . Albuterol Sulfate     Increased heart rate  . Codeine     REACTION: nausea  . Tetracycline     REACTION: rash

## 2011-11-09 ENCOUNTER — Telehealth: Payer: Self-pay | Admitting: Hematology and Oncology

## 2011-11-09 ENCOUNTER — Other Ambulatory Visit: Payer: Self-pay | Admitting: Hematology and Oncology

## 2011-11-09 ENCOUNTER — Ambulatory Visit (HOSPITAL_BASED_OUTPATIENT_CLINIC_OR_DEPARTMENT_OTHER): Payer: Medicare Other

## 2011-11-09 VITALS — BP 125/72 | HR 81 | Temp 98.4°F

## 2011-11-09 DIAGNOSIS — E538 Deficiency of other specified B group vitamins: Secondary | ICD-10-CM

## 2011-11-09 MED ORDER — CYANOCOBALAMIN 1000 MCG/ML IJ SOLN
1000.0000 ug | Freq: Once | INTRAMUSCULAR | Status: AC
  Administered 2011-11-09: 1000 ug via INTRAMUSCULAR

## 2011-11-09 NOTE — Telephone Encounter (Signed)
Thanks

## 2011-11-09 NOTE — Telephone Encounter (Signed)
Bridget Fry  Patient will likely benefit from long term opioids for dyspnea that is refractory in copd anxeity setting. It works better than valium.  Please tell hospice nurse I am recommending morphine. Can she get the dose and titration orders from hospice MD or does she need it from me ?  I would slightly change the prednisone to 20 mg daily x 1 week sarting 11/05/11 as Dr Vassie Loll ordered then 10mg  daily x 1 week then 5mg  daily to cintinue till I see patient again  When do I see patient again?

## 2011-11-09 NOTE — Telephone Encounter (Signed)
gve the pt's caregiver the jan-April 2013 appt calendar.

## 2011-11-09 NOTE — Telephone Encounter (Deleted)
Bridget Fry  I am not sure increased prednisone (except in AECOPD) is the answer for long term control of her dyspnea and anxiety componenent. Opioids work better than valium for dyspnea in this setting.  Please tell hospice nurse that I am recommending morphine oral and for her to communicate this with hospice docs.   When do I see patient again ?  THanks MR

## 2011-11-10 NOTE — Telephone Encounter (Signed)
I spoke with [facility]  Nurse and advised of prednisone changes. She will do this taper. Also they tried morphine and the pt had a reaction to this so she is on hycodan and valium. Also they d/c the xopenex nebulizer because the pt states it made her jittery and nervous. She is able to take xopenex hfa without issues.  Carron Curie, CMA

## 2011-11-10 NOTE — Telephone Encounter (Signed)
[email or web address].Jennifer Castillo, CMA  

## 2011-11-12 NOTE — Telephone Encounter (Signed)
Please add morphine to allergy - we will calirfy later what allergy. Please add albuterol and xopenex neb to cause nevrousness

## 2011-11-18 ENCOUNTER — Telehealth: Payer: Self-pay | Admitting: Hematology and Oncology

## 2011-11-18 NOTE — Telephone Encounter (Signed)
Pt called today re mthly appts. Per pt due to transportation and aides she now needs mthly in appts for wed or thurs @ 2 pm. appt moved to weds @ 2 pm.

## 2011-11-23 ENCOUNTER — Telehealth: Payer: Self-pay | Admitting: Internal Medicine

## 2011-11-23 NOTE — Telephone Encounter (Signed)
Ok to increase to bid lasix

## 2011-11-23 NOTE — Telephone Encounter (Signed)
Spoke with [place] Lions. She states that the pt has 2 plus pitting edema both legs. She is currently having no other issues. Takes lasix 40 mg daily. [place] Lions wants to know if okay to increase the lasix. Will forward to doc of the day since MR not currently in the office. Please advise, thanks! [place] Lions states please leave detailed msg on her VM if no answer when we call her back  LMOVM for [place] Lions to be made aware of recs per PW. Advised to call back if needed.

## 2011-11-26 ENCOUNTER — Telehealth: Payer: Self-pay | Admitting: Internal Medicine

## 2011-11-26 NOTE — Telephone Encounter (Signed)
[place] Lions called back---and stated that the info given earlier was wrong.  [place] Lions stated the pt has not been taking the increased dose per PW--pt is currently taking the lasix 40mg   1 tablet every morning, so this has nothing to do with her syncope episodes going on.  She does have swelling in her feet and pts HR has changed--irregular once in a while and now it is regular irregular--lois stated that the pt is just declining and she is not sure that any meds will help.  Will forward to doc of the day for any recs.  Please advise. thanks

## 2011-11-26 NOTE — Telephone Encounter (Signed)
Is a house call by palliative/hospice doctor warranted ? Please tell hospice to pursue best symptomatic measures. I am happy to talk to the nurse or doctor. Agree with nursing assessment

## 2011-11-26 NOTE — Telephone Encounter (Signed)
lmomtcb to get more information about pt.

## 2011-11-26 NOTE — Telephone Encounter (Signed)
Called and lmom for lois---she stated earlier that this was ok and that she would call the pt to inform her of recs.  i explained to lois in the message that per MR--have hospice to pursue best symptomatic measures--if she needs to talk to MR he is more than happy to help out.

## 2011-12-10 ENCOUNTER — Ambulatory Visit (HOSPITAL_BASED_OUTPATIENT_CLINIC_OR_DEPARTMENT_OTHER): Payer: Medicare Other

## 2011-12-10 VITALS — BP 155/77 | HR 83 | Temp 97.8°F

## 2011-12-10 DIAGNOSIS — E538 Deficiency of other specified B group vitamins: Secondary | ICD-10-CM

## 2011-12-10 MED ORDER — CYANOCOBALAMIN 1000 MCG/ML IJ SOLN
1000.0000 ug | Freq: Once | INTRAMUSCULAR | Status: AC
  Administered 2011-12-10: 1000 ug via INTRAMUSCULAR

## 2011-12-24 ENCOUNTER — Telehealth: Payer: Self-pay | Admitting: Internal Medicine

## 2011-12-24 MED ORDER — AZITHROMYCIN 250 MG PO TABS: ORAL_TABLET | ORAL | Status: AC

## 2011-12-24 NOTE — Telephone Encounter (Signed)
Do mucinex Do another zpak or avelox 400mg  daily x 5 days' If worse, hospice Md to visit if possi ble

## 2011-12-24 NOTE — Telephone Encounter (Signed)
Called and spoke with hospice nurse--she stated that the pt had a zpak and finished last week.  Pt stated that she still does not feel completely well and normally has to have a refill of the zpak.  Pt is on 5mg  prednisone daily.  She is not able to get any of the sputum up and nurse stated that her lung sounds are diminished .  Please advise.  Thanks  Allergies  Allergen Reactions  . Albuterol Sulfate     Increased heart rate  . Codeine     REACTION: nausea  . Morphine And Related   . Tetracycline     REACTION: rash  . Xopenex (Levalbuterol Hydrochloride)     Nebulizer caused nervousness, inhaler ok

## 2011-12-24 NOTE — Telephone Encounter (Signed)
Called and spoke with [facility] with hospice and she stated that the pt would like another zpak to take to clear this up.  Angelica Chessman is aware that this rx has been sent to pts pharmacy.

## 2011-12-30 ENCOUNTER — Other Ambulatory Visit: Payer: Self-pay | Admitting: Pulmonary Disease

## 2012-01-11 ENCOUNTER — Ambulatory Visit

## 2012-01-12 ENCOUNTER — Ambulatory Visit (HOSPITAL_BASED_OUTPATIENT_CLINIC_OR_DEPARTMENT_OTHER): Payer: Medicare Other

## 2012-01-12 VITALS — BP 124/74 | HR 70 | Temp 97.9°F

## 2012-01-12 DIAGNOSIS — E538 Deficiency of other specified B group vitamins: Secondary | ICD-10-CM

## 2012-01-12 MED ORDER — CYANOCOBALAMIN 1000 MCG/ML IJ SOLN
1000.0000 ug | Freq: Once | INTRAMUSCULAR | Status: AC
  Administered 2012-01-12: 1000 ug via INTRAMUSCULAR

## 2012-02-02 ENCOUNTER — Encounter: Payer: Self-pay | Admitting: Internal Medicine

## 2012-02-02 ENCOUNTER — Ambulatory Visit (INDEPENDENT_AMBULATORY_CARE_PROVIDER_SITE_OTHER): Payer: Medicare Other | Admitting: Internal Medicine

## 2012-02-02 ENCOUNTER — Encounter: Payer: Self-pay | Admitting: *Deleted

## 2012-02-02 VITALS — BP 146/86 | HR 67 | Temp 98.0°F | Ht 61.0 in | Wt 164.2 lb

## 2012-02-02 DIAGNOSIS — J449 Chronic obstructive pulmonary disease, unspecified: Secondary | ICD-10-CM

## 2012-02-02 MED ORDER — MOMETASONE FUROATE 50 MCG/ACT NA SUSP: 2.0000 | Freq: Every day | NASAL | Status: DC

## 2012-02-02 MED ORDER — PREDNISONE 10 MG PO TABS: ORAL_TABLET | ORAL | Status: DC

## 2012-02-02 NOTE — Patient Instructions (Signed)
You have mild attack of copd called COPD exacerbation Please take prednisone 40mg  once daily x 3 days, then 20mg  once daily x 3 days, then 10mg  once daily x 3 days, then 5mg  once dailyx 3 days and then go to 5mg  alternate day to continue For shortness of breath, please take 5mL of your hycodan twice daily  Check alpha 1 today REturn 3 months

## 2012-02-02 NOTE — Progress Notes (Signed)
Subjective:    Patient ID: Bridget Fry, female    DOB: 06/26/1931, 78 y.o.   MRN: [id number]  HPI Advanced COPD , DNAR, DNAI - Fev1 10/15/11:  0.91L/52%    - nasal cannula o2, pred 1mg  daily, budesonide nebs, brovana neb, spiriva, and alb prn  - class 3-4 disabling dyspnea  - Recurrrent AECOPD - did not tolerate darilesp early 2012 - tremors and tachycardia with albuterol; changed to xopenex nov 2012 - poor social support: lives alone, confused about medications - echo May 2011: EF 65%, gr 1 diast dysfun and mildly elevated PASP and c/w cor pulmonale - in home hospice since august 2012   OV 10/15/11: Followup COPD. On home hospice. Last visit 07/20/11 when hospice benefit was started. She finds the hospice benefit beneficial. Past week increased dyspnea, cough and white sputum. Called in zpak and she is day 3 of it. For unclear reasons the prednisone did not go through and she is not on it. She is no better. Still with class 3-4 dyspnea. Also c.o tachycardia with albuterol; refusing albuterol. Wants alternative. Wondering if has heart issues - echo shows cor pulmonale from 2011. No other changes .No chest pain.  Pulse ox on room air at rest: 88%. Spirometry today shows Fev1: 0.91L/52%  - gold stage 3 severe copd  Past, Social, Family:  Was given morophine for dyspnea by hospice (in setting of hycodan) and she developed delirum. Otherwise, no change.    REC Nurse will ensure that your prednisone will be called in  Finish zpak as before  Stop albuterol due to tachycardia  Have hospice do xopenex inhaler for you - take script  Your echo from 2011 shows cor pulmonale that is pressure effect of right heart due to bad copd. I do not see any other need to do further heart workup at this stage unless symptoms change  I think based on your disease severity you still qualify for hospice  ROV 3 months: alpha 1 check at followup  OV 02/02/2012 Followup COPD. Continues with hospice care. Says  AECopd 10 days ago and on 2nd round zpak and somewhat better. Did not get prednisone. Still with class 3-4 dyspnea. Lot of socal stress - see below. Spent lot of time talking about below. Discussed opioids for dyspnea: she is very reluctant to try morphine due to ADR (see above) although this time she says she got nauseated with morphine injection. Denies she got delirious. However, hycodan cough syrup helps a lot but she takes this only prn at night  Past, Family, Social reviewed: husband ww2 vet died last year; she is trying to get his pension processed. Wants help. Son Bridget Hua aged 48 years hsa been in SICU in [place] due to abdomoinal sepsis since 12/19/11 and apparently runs high mortality risk and is very critically ill. She herself had annual physical this month and wc 5k, hgb 12s and platelet shows drop to 82k and PMD investigating this. She is concerned   Review of Systems  Constitutional: Negative for fever and unexpected weight change.  HENT: Negative for ear pain, nosebleeds, congestion, sore throat, rhinorrhea, sneezing, trouble swallowing, dental problem, postnasal drip and sinus pressure.   Eyes: Negative for redness and itching.  Respiratory: Negative for cough, chest tightness, shortness of breath and wheezing.   Cardiovascular: Negative for palpitations and leg swelling.  Gastrointestinal: Negative for nausea and vomiting.  Genitourinary: Negative for dysuria.  Musculoskeletal: Negative for joint swelling.  Skin: Negative for rash.  Neurological: Negative  for headaches.  Hematological: Does not bruise/bleed easily.  Psychiatric/Behavioral: Negative for dysphoric mood. The patient is not nervous/anxious.        Objective:   Physical Exam  Vitals reviewed. Constitutional: She is oriented to person, place, and time. She appears well-developed and well-nourished. No distress.       overweight  HENT:  Head: Normocephalic and atraumatic.  Right Ear: External ear normal.  Left  Ear: External ear normal.  Mouth/Throat: Oropharynx is clear and moist. No oropharyngeal exudate.  Eyes: Conjunctivae and EOM are normal. Pupils are equal, round, and reactive to light. Right eye exhibits no discharge. Left eye exhibits no discharge. No scleral icterus.  Neck: Normal range of motion. Neck supple. No JVD present. No tracheal deviation present. No thyromegaly present.  Cardiovascular: Normal rate, regular rhythm, normal heart sounds and intact distal pulses.  Exam reveals no gallop and no friction rub.   No murmur heard. Pulmonary/Chest: Effort normal. No respiratory distress. She has no wheezes. She has no rales. She exhibits no tenderness.       Prolonged expiration and VISIBLY DYSPNEIC AS ALWAYS Abdominal: Soft. Bowel sounds are normal. She exhibits no distension and no mass. There is no tenderness. There is no rebound and no guarding.  Musculoskeletal: Normal range of motion. She exhibits no edema and no tenderness.  Lymphadenopathy:    She has no cervical adenopathy.  Neurological: She is alert and oriented to person, place, and time. She has normal reflexes. No cranial nerve deficit. She exhibits normal muscle tone. Coordination normal.  Skin: Skin is warm and dry. No rash noted. She is not diaphoretic. No erythema. No pallor.  Psychiatric: She has a normal mood and affect. Her behavior is normal. Judgment and thought content normal.       Excited, anxious as before          Assessment & Plan:

## 2012-02-03 ENCOUNTER — Encounter: Payer: Self-pay | Admitting: Internal Medicine

## 2012-02-07 ENCOUNTER — Telehealth: Payer: Self-pay | Admitting: Internal Medicine

## 2012-02-07 ENCOUNTER — Encounter: Payer: Self-pay | Admitting: Internal Medicine

## 2012-02-07 NOTE — Telephone Encounter (Signed)
[id number] x 1 

## 2012-02-08 ENCOUNTER — Telehealth: Payer: Self-pay | Admitting: Internal Medicine

## 2012-02-08 ENCOUNTER — Ambulatory Visit

## 2012-02-08 NOTE — Telephone Encounter (Signed)
Called [name] back and she advised there is nothing I can help her with and that she only wants to speak with Victorino Dike.Marland KitchenMarland Kitchen

## 2012-02-08 NOTE — Telephone Encounter (Signed)
MR requested to have Geronimo call him at the time of last OV, so I provided her with his pager number so she can contact him directly. Carron Curie, CMA

## 2012-02-08 NOTE — Telephone Encounter (Signed)
Call from [facility]    - Patient absolutely refused morphine for refractory dyspnea. This is due to prior morphine experience   - [facility] feels that me Dr MR should manage with copd because patient  MD and antibiotic shopping a lot   - fyi for you

## 2012-02-09 ENCOUNTER — Ambulatory Visit (HOSPITAL_BASED_OUTPATIENT_CLINIC_OR_DEPARTMENT_OTHER): Payer: Medicare Other

## 2012-02-09 VITALS — BP 163/77 | HR 88 | Temp 97.5°F

## 2012-02-09 DIAGNOSIS — E538 Deficiency of other specified B group vitamins: Secondary | ICD-10-CM

## 2012-02-09 MED ORDER — CYANOCOBALAMIN 1000 MCG/ML IJ SOLN
1000.0000 ug | Freq: Once | INTRAMUSCULAR | Status: AC
  Administered 2012-02-09: 1000 ug via INTRAMUSCULAR

## 2012-02-14 NOTE — Assessment & Plan Note (Signed)
Unclear if there is mild AECOPD or just copd with declining functional status with refractroy class 4 dyspnea compounded by anxiety and social stress. We spent more than alloted 15 minutes for a total of 25 minutes of which >50% was in counseling directly face to face. She is aboslutely adamant that she will not try morphine for dyspnea even though hycodan helps her but she takes it only prn  PLAN You have mild attack of copd called COPD exacerbation Please take prednisone 40mg  once daily x 3 days, then 20mg  once daily x 3 days, then 10mg  once daily x 3 days, then 5mg  once dailyx 3 days and then go to 5mg  alternate day to continue For shortness of breath, please take 5mL of your hycodan twice daily  Check alpha 1 today REturn 3 months

## 2012-02-24 ENCOUNTER — Encounter: Payer: Self-pay | Admitting: Internal Medicine

## 2012-03-07 ENCOUNTER — Ambulatory Visit

## 2012-03-08 ENCOUNTER — Ambulatory Visit (HOSPITAL_BASED_OUTPATIENT_CLINIC_OR_DEPARTMENT_OTHER): Payer: Medicare Other

## 2012-03-08 VITALS — BP 142/88 | HR 84 | Temp 97.5°F

## 2012-03-08 DIAGNOSIS — E538 Deficiency of other specified B group vitamins: Secondary | ICD-10-CM

## 2012-03-08 MED ORDER — CYANOCOBALAMIN 1000 MCG/ML IJ SOLN
1000.0000 ug | Freq: Once | INTRAMUSCULAR | Status: AC
  Administered 2012-03-08: 1000 ug via INTRAMUSCULAR

## 2012-03-28 ENCOUNTER — Telehealth: Payer: Self-pay | Admitting: Hematology and Oncology

## 2012-03-28 NOTE — Telephone Encounter (Signed)
Moved 4/24 appt to 5/22 due to LO out of office. D/t per pt due to she needs wed PM and is on oxygen that doesn't last long so she can't have 2 appts in one day. Pt already schedule w/[place] for o5/21. Pt aware that she will keep inj appt on 4/24 @ 2 pm.

## 2012-04-04 ENCOUNTER — Other Ambulatory Visit

## 2012-04-04 ENCOUNTER — Ambulatory Visit: Admitting: Hematology and Oncology

## 2012-04-05 ENCOUNTER — Other Ambulatory Visit: Admitting: Lab

## 2012-04-05 ENCOUNTER — Ambulatory Visit: Payer: Medicare Other

## 2012-04-05 ENCOUNTER — Ambulatory Visit: Admitting: Hematology and Oncology

## 2012-04-05 ENCOUNTER — Telehealth: Payer: Self-pay | Admitting: Hematology and Oncology

## 2012-04-05 NOTE — Telephone Encounter (Signed)
Pt called today to r/s 4/24 inj appt to 4/30 due to she is not feeling well. Pt given new d/t for 4/30.

## 2012-04-10 ENCOUNTER — Telehealth: Payer: Self-pay | Admitting: *Deleted

## 2012-04-10 NOTE — Telephone Encounter (Signed)
patient called and rescheduled for 04-12-2012 at 4:00pm patient confirmed over the phone

## 2012-04-11 ENCOUNTER — Ambulatory Visit: Payer: Medicare Other

## 2012-04-12 ENCOUNTER — Ambulatory Visit: Payer: Medicare Other

## 2012-04-12 ENCOUNTER — Telehealth: Payer: Self-pay | Admitting: Hematology and Oncology

## 2012-04-12 NOTE — Telephone Encounter (Signed)
pt called and l./m,called pt and she wishes to cx 5/1 b12 inj and she wants toi see if hospice can do this for her and she will call   aom

## 2012-05-02 ENCOUNTER — Encounter: Payer: Self-pay | Admitting: Internal Medicine

## 2012-05-02 ENCOUNTER — Ambulatory Visit (INDEPENDENT_AMBULATORY_CARE_PROVIDER_SITE_OTHER): Payer: Medicare Other | Admitting: Internal Medicine

## 2012-05-02 ENCOUNTER — Encounter: Payer: Self-pay | Admitting: *Deleted

## 2012-05-02 ENCOUNTER — Other Ambulatory Visit: Payer: Self-pay | Admitting: Lab

## 2012-05-02 ENCOUNTER — Ambulatory Visit: Payer: Self-pay | Admitting: Hematology and Oncology

## 2012-05-02 DIAGNOSIS — R0789 Other chest pain: Secondary | ICD-10-CM | POA: Insufficient documentation

## 2012-05-02 DIAGNOSIS — J441 Chronic obstructive pulmonary disease with (acute) exacerbation: Secondary | ICD-10-CM

## 2012-05-02 DIAGNOSIS — R079 Chest pain, unspecified: Secondary | ICD-10-CM

## 2012-05-02 MED ORDER — LEVOFLOXACIN 500 MG PO TABS: 500.0000 mg | ORAL_TABLET | Freq: Every day | ORAL | Status: AC

## 2012-05-02 MED ORDER — DOXYCYCLINE HYCLATE 100 MG PO TABS: 100.0000 mg | ORAL_TABLET | Freq: Two times a day (BID) | ORAL | Status: DC

## 2012-05-02 MED ORDER — PREDNISONE 10 MG PO TABS: ORAL_TABLET | ORAL | Status: DC

## 2012-05-02 NOTE — Assessment & Plan Note (Signed)
#   COPD attack You have mild attack of copd called COPD exacerbation Please take prednisone 40mg  once daily x 3 days, then 20mg  once daily x 3 days, then 10mg  once daily x 3 days, then 5mg  once dailyx 3 days and then go to 5mg  alternate day to continue  take levaquin 500mg  once daily  X 6 days  #Followup  - 3 months or sooner if needed - CAT score at followup

## 2012-05-02 NOTE — Assessment & Plan Note (Signed)
#  Chest pain  - see Dr Katrinka Blazing - cardiology; referral done

## 2012-05-02 NOTE — Patient Instructions (Signed)
#   COPD attack You have mild attack of copd called COPD exacerbation Please take prednisone 40mg  once daily x 3 days, then 20mg  once daily x 3 days, then 10mg  once daily x 3 days, then 5mg  once dailyx 3 days and then go to 5mg  alternate day to continue  take levaquin 500mg  once daily  X 6 days  #Chest pain  - see Dr Katrinka Blazing - cardiology; referral done  #Followup  - 3 months or sooner if needed - CAT score at followup

## 2012-05-02 NOTE — Progress Notes (Signed)
Subjective:    Patient ID: Bridget Fry, female    DOB: [birth year], 78 y.o.   MRN: [id number]  HPI Advanced COPD , DNAR, DNAI - Fev1 10/15/11:  0.91L/52%    - nasal cannula o2, pred 1mg  daily, budesonide nebs, brovana neb, spiriva, and alb prn  - class 3-4 disabling dyspnea  - Recurrrent AECOPD - did not tolerate darilesp early 2012 - tremors and tachycardia with albuterol; changed to xopenex nov 2012 - poor social support: lives alone, confused about medications - echo May 2011: EF 65%, gr 1 diast dysfun and mildly elevated PASP and c/w cor pulmonale - in home hospice since august 2012   OV 10/15/11: Followup COPD. On home hospice. Last visit 07/20/11 when hospice benefit was started. She finds the hospice benefit beneficial. Past week increased dyspnea, cough and white sputum. Called in zpak and she is day 3 of it. For unclear reasons the prednisone did not go through and she is not on it. She is no better. Still with class 3-4 dyspnea. Also c.o tachycardia with albuterol; refusing albuterol. Wants alternative. Wondering if has heart issues - echo shows cor pulmonale from 2011. No other changes .No chest pain.  Pulse ox on room air at rest: 88%. Spirometry today shows Fev1: 0.91L/52%  - gold stage 3 severe copd  Past, Social, Family:  Was given morophine for dyspnea by hospice (in setting of hycodan) and she developed delirum. Otherwise, no change.    REC Nurse will ensure that your prednisone will be called in  Finish zpak as before  Stop albuterol due to tachycardia  Have hospice do xopenex inhaler for you - take script  Your echo from 2011 shows cor pulmonale that is pressure effect of right heart due to bad copd. I do not see any other need to do further heart workup at this stage unless symptoms change  I think based on your disease severity you still qualify for hospice  ROV 3 months: alpha 1 check at followup  OV 02/02/2012 Followup COPD. Continues with hospice care. Says  AECopd 10 days ago and on 2nd round zpak and somewhat better. Did not get prednisone. Still with class 3-4 dyspnea. Lot of socal stress - see below. Spent lot of time talking about below. Discussed opioids for dyspnea: she is very reluctant to try morphine due to ADR (see above) although this time she says she got nauseated with morphine injection. Denies she got delirious. However, hycodan cough syrup helps a lot but she takes this only prn at night  Past, Family, Social reviewed: husband ww2 vet died last year; she is trying to get his pension processed. Wants help. Son Onalee Hua aged 48 years hsa been in SICU in [place] due to abdomoinal sepsis since 12/19/11 and apparently runs high mortality risk and is very critically ill. She herself had annual physical this month and wc 5k, hgb 12s and platelet shows drop to 82k and PMD investigating this. She is concerned  REC You have mild attack of copd called COPD exacerbation  Please take prednisone 40mg  once daily x 3 days, then 20mg  once daily x 3 days, then 10mg  once daily x 3 days, then 5mg  once dailyx 3 days and then go to 5mg  alternate day to continue  For shortness of breath, please take 5mL of your hycodan twice daily  Check alpha 1 today  REturn 3 months   OV 05/02/2012  Past 1 week - atypical chest pains, increased dyspnea - class 4, increased  cough but no sputum. CAT score 33 and reflects very high symptom burdern. Still refusing opioids for dyspnea relief. No sick contact. Denies edema, hemoptysis   CAT COPD Symptom and Quality of Life Score ([employer])  0 (no burden) to 5 (highest burden)  Never Cough -> Cough all the time 2  No phlegm in chest -> Chest is full of phlegm 4  No chest tightness -> Chest feels very tight 4  No dyspnea for 1 flight stairs/hill -> Very dyspneic for 1 flight of stairs 5  No limitations for ADL at home -> Very limited with ADL at home 5  Confident leaving home -> Not at all confident leaving home  4  Sleep soundly -> Do not sleep soundly because of lung condition 4  Lots of Energy -> No energy at all 5  TOTAL Score (max 40)  33    Past, Family, Social reviewed: soon out of  Hospital and at home in [place]      Review of Systems  Constitutional: Negative for fever and unexpected weight change.  HENT: Negative for ear pain, nosebleeds, congestion, sore throat, rhinorrhea, sneezing, trouble swallowing, dental problem, postnasal drip and sinus pressure.   Eyes: Negative for redness and itching.  Respiratory: Positive for cough and shortness of breath. Negative for chest tightness and wheezing.   Cardiovascular: Negative for palpitations and leg swelling.  Gastrointestinal: Negative for nausea and vomiting.  Genitourinary: Negative for dysuria.  Musculoskeletal: Negative for joint swelling.  Skin: Negative for rash.  Neurological: Negative for headaches.  Hematological: Does not bruise/bleed easily.  Psychiatric/Behavioral: Negative for dysphoric mood. The patient is not nervous/anxious.        Objective:   Physical Exam Vitals reviewed. Constitutional: She is oriented to person, place, and time. She appears well-developed and well-nourished. No distress.       overweight  HENT:  Head: Normocephalic and atraumatic.  Right Ear: External ear normal.  Left Ear: External ear normal.  Mouth/Throat: Oropharynx is clear and moist. No oropharyngeal exudate.  Eyes: Conjunctivae and EOM are normal. Pupils are equal, round, and reactive to light. Right eye exhibits no discharge. Left eye exhibits no discharge. No scleral icterus.  Neck: Normal range of motion. Neck supple. No JVD present. No tracheal deviation present. No thyromegaly present.  Cardiovascular: Normal rate, regular rhythm, normal heart sounds and intact distal pulses.  Exam reveals no gallop and no friction rub.   No murmur heard. Pulmonary/Chest: Effort normal. No respiratory distress. She has no wheezes. She has no  rales. She exhibits no tenderness.       Prolonged expiration and VISIBLY DYSPNEIC AS ALWAYS and PURSE LIP BREATHING + Abdominal: Soft. Bowel sounds are normal. She exhibits no distension and no mass. There is no tenderness. There is no rebound and no guarding.  Musculoskeletal: Normal range of motion. She exhibits no edema and no tenderness.  Lymphadenopathy:    She has no cervical adenopathy.  Neurological: She is alert and oriented to person, place, and time. She has normal reflexes. No cranial nerve deficit. She exhibits normal muscle tone. Coordination normal.  Skin: Skin is warm and dry. No rash noted. She is not diaphoretic. No erythema. No pallor.  Psychiatric: She has a normal mood and affect. Her behavior is normal. Judgment and thought content normal.       Excited, anxious as before          Assessment & Plan:

## 2012-05-03 ENCOUNTER — Ambulatory Visit (HOSPITAL_BASED_OUTPATIENT_CLINIC_OR_DEPARTMENT_OTHER): Payer: Medicare Other | Admitting: Hematology and Oncology

## 2012-05-03 ENCOUNTER — Encounter: Payer: Self-pay | Admitting: Hematology and Oncology

## 2012-05-03 ENCOUNTER — Telehealth: Payer: Self-pay | Admitting: Hematology and Oncology

## 2012-05-03 ENCOUNTER — Other Ambulatory Visit (HOSPITAL_BASED_OUTPATIENT_CLINIC_OR_DEPARTMENT_OTHER): Payer: Medicare Other | Admitting: Lab

## 2012-05-03 VITALS — BP 131/83 | HR 85 | Temp 97.3°F | Ht 61.0 in | Wt 171.2 lb

## 2012-05-03 DIAGNOSIS — D696 Thrombocytopenia, unspecified: Secondary | ICD-10-CM

## 2012-05-03 DIAGNOSIS — E538 Deficiency of other specified B group vitamins: Secondary | ICD-10-CM

## 2012-05-03 LAB — CBC WITH DIFFERENTIAL/PLATELET
BASO%: 0.6 % (ref 0.0–2.0)
EOS%: 4.7 % (ref 0.0–7.0)
HCT: 38.1 % (ref 34.8–46.6)
LYMPH%: 13.9 % — ABNORMAL LOW (ref 14.0–49.7)
MCH: 27 pg (ref 25.1–34.0)
MCHC: 32.4 g/dL (ref 31.5–36.0)
NEUT%: 69.5 % (ref 38.4–76.8)
Platelets: 86 10*3/uL — ABNORMAL LOW (ref 145–400)
lymph#: 0.5 10*3/uL — ABNORMAL LOW (ref 0.9–3.3)

## 2012-05-03 LAB — BASIC METABOLIC PANEL
BUN: 21 mg/dL (ref 6–23)
CO2: 35 mEq/L — ABNORMAL HIGH (ref 19–32)
Chloride: 96 mEq/L (ref 96–112)
Glucose, Bld: 176 mg/dL — ABNORMAL HIGH (ref 70–99)
Potassium: 4.3 mEq/L (ref 3.5–5.3)

## 2012-05-03 MED ORDER — CYANOCOBALAMIN 1000 MCG/ML IJ SOLN
1000.0000 ug | Freq: Once | INTRAMUSCULAR | Status: AC
  Administered 2012-05-03: 1000 ug via INTRAMUSCULAR

## 2012-05-03 NOTE — Progress Notes (Signed)
Spoke with Lizbeth Bark, triage nurse at [facility] , and informed her that a rx order will be faxed to pt's nurse  Alease Medina  For pt to receive monthly B12 injection with hospice. Fax    [phone].

## 2012-05-03 NOTE — Progress Notes (Signed)
This office note has been dictated.

## 2012-05-03 NOTE — Progress Notes (Signed)
CC:   Georgann Housekeeper, MD Caralyn Guile. Ethelene Hal, M.D. Kalman Shan, MD  IDENTIFYING STATEMENT:  The patient is a 78 year old woman with history of chronic thrombocytopenia and B12 deficiency who presents for followup.  The patient has baseline COPD.  She is on oxygen.  Her activities are limited.  She is essentially wheelchair bound.  No history of bruising or overt bleeding.  Has chronic bruising due to steroids.  CBC obtained 05/03/2012:  White cell count 3.8, hemoglobin 12.3, hematocrit 38.1, platelets 86 (79).  MEDICATIONS:  Reviewed and updated.  ALLERGIES:  Albuterol, codeine, morphine, tetracycline, Xopenex.  PHYSICAL EXAMINATION:  General:  The patient is a well-appearing, well- nourished woman in no distress.  Vitals:  Pulse 85, blood pressure 131/83, temperature 97.3, respirations 20, weight 171 pounds.  HEENT: Sclerae anicteric.  Mouth moist.  Chest:  Clear.  CVS is unremarkable. Abdomen:  Soft.  Extremities:  No edema.  LABORATORY DATA:  CBC as above.  IMPRESSION AND PLAN:  The patient is a 78 year old woman with a history of chronic thrombocytopenia and B12 deficiency.  She has been seen in the [facility] receiving monthly B12 injections.  I do not see why the patient cannot receive this at home.  She enrolled with hospice so I will inquire if this can be given in her home environment. She follows up in six months time for ongoing assessment.   DICTATION ENDS HERE    ______________________________ Laurice Record, M.D. LIO/MEDQ  D:  05/03/2012  T:  05/03/2012  Job:  [id number]

## 2012-05-03 NOTE — Telephone Encounter (Signed)
Gv pt appt for nov2013 °

## 2012-05-05 ENCOUNTER — Ambulatory Visit

## 2012-05-09 ENCOUNTER — Telehealth: Payer: Self-pay | Admitting: Internal Medicine

## 2012-05-09 MED ORDER — LEVOFLOXACIN 500 MG PO TABS: 500.0000 mg | ORAL_TABLET | Freq: Every day | ORAL | Status: AC

## 2012-05-09 NOTE — Telephone Encounter (Signed)
Charlette Caffey that the pt had called as well and MR was recommending the following: RAMASWAMY,MURALI, MD 05/09/2012 3:27 PM Signed  take levaquin 500mg  once daily X 6 days - 2nd round  Re: prednisone - stick to plan from recent OV. No change  . Carron Curie, CMA

## 2012-05-09 NOTE — Telephone Encounter (Signed)
HOSPICE PT--Spoke with the pt and she was seen on 05-02-12 and given rx for Levaquin and pred taper. The pt states she finished Levaquin yesterday and does feel much better, but does not feel completely well. She states she still has a scratchy throat and a lot of nasal congestion and some chest congestion as well. She states SOB has improved some as well as cough. The pt states she needs another round of Levaquin. She states "it always takes 2 rounds to get well." The pt is currently also on pred taper, she is just started 5mg  today and will take this for 3 days then go to 5 mg every other day. The pt wants to know if she gets another round of Levaquin should she just continue the prednisone as directed or does this need to be changed? Please advise. Carron Curie, CMA Allergies  Allergen Reactions  . Albuterol Sulfate     Increased heart rate  . Codeine     REACTION: nausea  . Morphine And Related   . Tetracycline     REACTION: rash  . Xopenex (Levalbuterol Hydrochloride)     Nebulizer caused nervousness, inhaler ok

## 2012-05-09 NOTE — Telephone Encounter (Signed)
take levaquin 500mg  once daily  X 6 days - 2nd round  Re: prednisone - stick to plan from recent OV. No change

## 2012-05-09 NOTE — Telephone Encounter (Signed)
Called, spoke with pt.  I informed her of below per MR.  She verbalized understanding of this and is aware levaquin rx sent to [facility] on [address].

## 2012-05-15 ENCOUNTER — Other Ambulatory Visit: Payer: Self-pay | Admitting: Internal Medicine

## 2012-06-13 ENCOUNTER — Telehealth: Payer: Self-pay | Admitting: Internal Medicine

## 2012-06-13 MED ORDER — AZITHROMYCIN 250 MG PO TABS: ORAL_TABLET | ORAL | Status: AC

## 2012-06-13 MED ORDER — PREDNISONE 10 MG PO TABS: ORAL_TABLET | ORAL | Status: DC

## 2012-06-13 NOTE — Telephone Encounter (Signed)
please call in zpak  I also recommend the following: Take prednisone 40 mg daily x 2 days, then 20mg  daily x 2 days, then 10mg  daily x 2 days, then 5mg  daily x 2 days and stop or go down to baseline dose if she is on chronic steroids   I made lexa aware of this and needed nothing further

## 2012-06-13 NOTE — Telephone Encounter (Signed)
I spoke with pt and she c/o cough w/ green phlem, blowing out green phlem, slight chest tx, slight sore throat x yesterday. Denies ay f/c/s/n/v/. Pt is requesting zpak be called in. Please advise MR, thanks  Allergies  Allergen Reactions  . Albuterol Sulfate     Increased heart rate  . Codeine     REACTION: nausea  . Morphine And Related   . Tetracycline     REACTION: rash  . Xopenex (Levalbuterol Hydrochloride)     Nebulizer caused nervousness, inhaler ok     [facility] [address]

## 2012-06-13 NOTE — Telephone Encounter (Signed)
Spoke with pt and notified of recs per MR. She verbalized understanding and states she does take pred every other day as maintenance, but does not have enough pills to due the taper so I have refilled this for her.

## 2012-06-13 NOTE — Telephone Encounter (Signed)
She has aecopd  pleaes call in zpak  I also recommend the following: Take prednisone 40 mg daily x 2 days, then 20mg  daily x 2 days, then 10mg  daily x 2 days, then 5mg  daily x 2 days and stop or go down to baseline dose if she is on chronic steroids

## 2012-06-16 ENCOUNTER — Telehealth: Payer: Self-pay | Admitting: Internal Medicine

## 2012-06-16 MED ORDER — AZITHROMYCIN 250 MG PO TABS: ORAL_TABLET | ORAL | Status: AC

## 2012-06-16 NOTE — Telephone Encounter (Signed)
Called, spoke with pt.  MR sent in Z pak on 06/13/12.  Pt states he usually gives her 2 rounds of this bc just one doesn't help.  States she is still taking the first pack - will finish on Sunday.  Reports she is not much better and believes she would benefit from another zpak.  Hospice Pt.  As MR is off today, will send msg to doc of the day -- Dr. Vassie Loll, pls advise.  Thank you.

## 2012-06-16 NOTE — Telephone Encounter (Signed)
Rx for zpack sent and spoke with pt and made aware that this was done.

## 2012-06-16 NOTE — Telephone Encounter (Signed)
Ok for second z pak to be called in

## 2012-08-18 ENCOUNTER — Telehealth: Payer: Self-pay | Admitting: Internal Medicine

## 2012-08-18 NOTE — Telephone Encounter (Signed)
Called and spoke with pt and she stated that hospice is doing her evals every 2 months and she is afraid that they are going to d/c her from hospice and she is so concerned about this happening since she will not be able to afford her meds.  She is wanting to know what would happen if she had to be off some of these meds that she would not be able to afford like her neb meds.  MR please advise.  Pt is very anxious about what is going on.  Thanks  Allergies  Allergen Reactions  . Albuterol Sulfate     Increased heart rate  . Codeine     REACTION: nausea  . Morphine And Related   . Tetracycline     REACTION: rash  . Xopenex (Levalbuterol Hydrochloride)     Nebulizer caused nervousness, inhaler ok

## 2012-08-21 NOTE — Telephone Encounter (Signed)
Pt made aware of MR recs and thoughts and will discuss with him more at ov on 09/01/12.

## 2012-08-21 NOTE — Telephone Encounter (Signed)
Medicare will pick up services just like pre-hospice  I can discuss fears in detail. Please give her appt 1st avail

## 2012-08-29 ENCOUNTER — Telehealth: Payer: Self-pay | Admitting: Internal Medicine

## 2012-08-29 NOTE — Telephone Encounter (Signed)
Will forward to MR as FYI 

## 2012-08-29 NOTE — Telephone Encounter (Signed)
Nurse adds that "anything that dr Marchelle Gearing can put in rov notes this fri re: hospice care would be appreciated". She says that it's time to recert pt for this and any notes would be very helpful". Hazel Sams

## 2012-09-01 ENCOUNTER — Encounter: Payer: Self-pay | Admitting: Internal Medicine

## 2012-09-01 ENCOUNTER — Ambulatory Visit (INDEPENDENT_AMBULATORY_CARE_PROVIDER_SITE_OTHER): Payer: Medicare Other | Admitting: Internal Medicine

## 2012-09-01 VITALS — BP 140/72 | HR 79 | Temp 97.0°F | Ht 61.0 in | Wt 173.2 lb

## 2012-09-01 DIAGNOSIS — J441 Chronic obstructive pulmonary disease with (acute) exacerbation: Secondary | ICD-10-CM

## 2012-09-01 DIAGNOSIS — Z23 Encounter for immunization: Secondary | ICD-10-CM

## 2012-09-01 DIAGNOSIS — J449 Chronic obstructive pulmonary disease, unspecified: Secondary | ICD-10-CM

## 2012-09-01 MED ORDER — AZITHROMYCIN 250 MG PO TABS: ORAL_TABLET | ORAL | Status: DC

## 2012-09-01 MED ORDER — PREDNISONE 10 MG PO TABS: ORAL_TABLET | ORAL | Status: DC

## 2012-09-01 NOTE — Assessment & Plan Note (Signed)
#  COPD flare  - you have not recovered from your flare yet - take another zpak with 2 refills  - Please take Take prednisone 40mg  once daily x 3 days, then 30mg  once daily x 3 days, then 20mg  once daily x 3 days, then prednisone 10mg  once daily  x 3 days and then back to scheduled alternate day schedule

## 2012-09-01 NOTE — Progress Notes (Signed)
Subjective:    Patient ID: Bridget Fry, female    DOB: [birth year], 78 y.o.   MRN: [id number]  HPI #Advanced COPD , DNAR, DNAI - Fev1 10/15/11:  0.91L/52%    - nasal cannula o2, pred 1mg  daily, budesonide nebs, brovana neb, spiriva, and alb prn  - class 3-4 disabling dyspnea  -- tremors and tachycardia with albuterol; changed to xopenex nov 2012 - poor social support: lives alone, confused about medications - echo May 2011: EF 65%, gr 1 diast dysfun and mildly elevated PASP and c/w cor pulmonale - in home hospice since august 2012  # Recurrrent AECOPD - did not tolerate darilesp early 2012 - Nov 2012 OPD Rx - Feb 2013 - - feb 2013 - May 2013  #CLass 4 dyspnea  - morphine causes delirium  OV 09/01/2012 Followup COPD. Since last office visitin May 2013. She and care taker Earleen Reaper report progressive decline in funciton since last visit in may 2013. Currently not cooking, less going out per [place]. General palliative performance Scale (PPS) score is 40  Based on  periodic drowsiness and confusion (though on intake she    She still continues with baseline class 4 dyspnea at rest   Past few days more dyspneic, cough and wheeze. CAT score has decline to 43 out of total 45   GSK, 0 to 5 scale CAT SCORE 04/24/12 09/01/2012   Never Cough -> Cough all the time 2 5  No phlegm in chest -> Chest is full of phlegm 4 3  No chest tightness -> Chest feels very tight 4 4  No dyspnea for 1 flight stairs/hill -> Very dyspneic for 1 flight of stairs 5 5  No limitations for ADL at home -> Very limited with ADL at home 5 5  Confident leaving home -> Not at all confident leaving home 4 5  Sleep soundly -> Do not sleep soundly because of lung condition 4 5  Lots of Energy -> No energy at all 5 5  TOTAL Score (max 40)  33 42    Review of Systems  Constitutional: Negative for fever and unexpected weight change.  HENT: Negative for ear pain, nosebleeds, congestion, sore throat, rhinorrhea,  sneezing, trouble swallowing, dental problem, postnasal drip and sinus pressure.   Eyes: Negative for redness and itching.  Respiratory: Negative for cough, chest tightness, shortness of breath and wheezing.   Cardiovascular: Negative for palpitations and leg swelling.  Gastrointestinal: Negative for nausea and vomiting.  Genitourinary: Negative for dysuria.  Musculoskeletal: Negative for joint swelling.  Skin: Negative for rash.  Neurological: Negative for headaches.  Hematological: Does not bruise/bleed easily.  Psychiatric/Behavioral: Negative for dysphoric mood. The patient is not nervous/anxious.        Objective:   Physical Exam Vitals reviewed. Constitutional: She is oriented to person, place, and time. She appears well-developed and well-nourished. No distress.       overweight  HENT:  Head: Normocephalic and atraumatic.  Right Ear: External ear normal.  Left Ear: External ear normal.  Mouth/Throat: Oropharynx is clear and moist. No oropharyngeal exudate.  Eyes: Conjunctivae and EOM are normal. Pupils are equal, round, and reactive to light. Right eye exhibits no discharge. Left eye exhibits no discharge. No scleral icterus.  Neck: Normal range of motion. Neck supple. No JVD present. No tracheal deviation present. No thyromegaly present.  Cardiovascular: Normal rate, regular rhythm, normal heart sounds and intact distal pulses.  Exam reveals no gallop and no friction rub.   No murmur  heard. Pulmonary/Chest: Effort normal. No respiratory distress. She has no wheezes. She has no rales. She exhibits no tenderness.       Prolonged expiration and VISIBLY DYSPNEIC AS ALWAYS and PURSE LIP BREATHING + Abdominal: Soft. Bowel sounds are normal. She exhibits no distension and no mass. There is no tenderness. There is no rebound and no guarding.  Musculoskeletal: Normal range of motion. She exhibits no edema and no tenderness.  Lymphadenopathy:    She has no cervical adenopathy.    Neurological: She is alert and oriented to person, place, and time. She has normal reflexes. No cranial nerve deficit. She exhibits normal muscle tone. Coordination normal.  Skin: Skin is warm and dry. No rash noted. She is not diaphoretic. No erythema. No pallor.  Psychiatric: She has a normal mood and affect. Her behavior is normal. Judgment and thought content normal.             Assessment & Plan:

## 2012-09-01 NOTE — Patient Instructions (Addendum)
#  COPD flare  - you have not recovered from your flare yet - take another zpak with 2 refills  - Please take Take prednisone 40mg  once daily x 3 days, then 30mg  once daily x 3 days, then 20mg  once daily x 3 days, then prednisone 10mg  once daily  x 3 days and then back to scheduled alternate day schedule  #COPD - your copd is end stage - your CAT score is 42 of 45 and high symptom burden  - the prognosis is very poor and expected life expectancy in setting of this with frequent attacks and poor quality of life and shortness of breath even talking is less than 6 months on average  - you definitely need continued hospice - have flu shot today 09/01/2012 - continue medications  #Followup  - 2 months or sooner if needed

## 2012-09-01 NOTE — Assessment & Plan Note (Addendum)
#  COPD  - COPD is endstage. O2 depedndent. Class 4 refractory dyspnea. ECOG 3. CAT score 42 out of 45 reflecting super high symptom burden from COPD. Palliative eprformance scale shows score 40. Alll this is very consistent with fact that prognosis is less than 6 monts  PLAN - your copd is end stage - your CAT score is 42 of 45 and high symptom burden  - the prognosis is very poor and expected life expectancy in setting of this with frequent attacks and poor quality of life and shortness of breath even talking is less than 6 months on average  - you definitely need continued hospice - have flu shot today 09/01/2012 - continue medications  #Followup  - 2 months or sooner if needed   > 50% of this > 25 min visit spent in face to face counseling (15 min visit converted to 25 min)

## 2012-10-13 ENCOUNTER — Telehealth: Payer: Self-pay | Admitting: Internal Medicine

## 2012-10-13 DIAGNOSIS — R251 Tremor, unspecified: Secondary | ICD-10-CM

## 2012-10-13 NOTE — Telephone Encounter (Signed)
Spoke with French Ana from [facility], patient is a med change to stop/decrease hand tremors. Patient has been feeling weak and would like to know if their is something on her med list that can be take off to help with this.  Tracey requesting a call back today.  MR please advise, thank you!

## 2012-10-13 NOTE — Telephone Encounter (Signed)
i called and attmepted to speak with Bridget Fry and she was out of range and her phone kept breaking up--she requested that i call the pt directly.  i called and spoke with pt and she stated that she is taking the xopenex but is still having the tremors and she is having a hard time walking again.  She stated that her legs are like spaghetti sticks when she walks.  She is aware that order will be placed for her to see neurology.  Nothing further is needed.

## 2012-10-13 NOTE — Telephone Encounter (Signed)
Change albuterol to xopenex 0.63 dose 4 times daily If already done and not helping, please refer to neurology

## 2012-10-17 ENCOUNTER — Other Ambulatory Visit: Payer: Self-pay | Admitting: Internal Medicine

## 2012-10-20 ENCOUNTER — Telehealth: Payer: Self-pay | Admitting: Internal Medicine

## 2012-10-20 NOTE — Telephone Encounter (Signed)
Will address at fu. Might need to check platelets

## 2012-10-20 NOTE — Telephone Encounter (Signed)
Called, spoke with hospice nurse, [place] Lions.  States her and Dr Anne Fu seen pt today.  Dr. Anne Fu would like MR to be aware pt continues to have nosebleeds.  Pt has been on pred tapers, on nebulizers, and nasonex -- [place] Lions states Dr. Anne Fu wonders if all these steroids could be causing the nosebleeds.  Also, [place] Lions states pt has hemmroid bleeding -- [place] Lions wonders if this could be from low platelet counts.  Pt has an appt with MR on Monday, Nov 11.   Lions states this is just FYI for this appt and no need to call her back.  Will route to MR as FYI.

## 2012-10-23 ENCOUNTER — Ambulatory Visit (INDEPENDENT_AMBULATORY_CARE_PROVIDER_SITE_OTHER): Payer: Medicare Other | Admitting: Internal Medicine

## 2012-10-23 VITALS — BP 120/68 | HR 96 | Temp 97.6°F | Ht 61.0 in | Wt 171.8 lb

## 2012-10-23 DIAGNOSIS — R5381 Other malaise: Secondary | ICD-10-CM

## 2012-10-23 DIAGNOSIS — J4489 Other specified chronic obstructive pulmonary disease: Secondary | ICD-10-CM

## 2012-10-23 DIAGNOSIS — R5383 Other fatigue: Secondary | ICD-10-CM

## 2012-10-23 DIAGNOSIS — J449 Chronic obstructive pulmonary disease, unspecified: Secondary | ICD-10-CM

## 2012-10-23 DIAGNOSIS — R040 Epistaxis: Secondary | ICD-10-CM

## 2012-10-23 NOTE — Patient Instructions (Addendum)
#  FAtigue  - due to copd and sedantary life; referring you to pulmonary rehab  #EPistaxis  -platelet count is stable at 81,000; this is not the reason for epistaxis - I am referring you to ENT for evauation; right nostril looks abraded  #Hemorrhoids  - hold of GI eval as of now  #COPD  - stable disease; continue nebs  - CMA wil ensure you are uptodate with flu shot  #Followup 3 months or sooner if needed

## 2012-10-23 NOTE — Progress Notes (Signed)
Subjective:    Patient ID: Bridget Fry, female    DOB: 01/03/1931, 78 y.o.   MRN: [id number]  HPI #Advanced COPD , DNAR, DNAI - Fev1 10/15/11:  0.91L/52%    - nasal cannula o2, pred 1mg  daily, budesonide nebs, brovana neb, spiriva, and alb prn  - class 3-4 disabling dyspnea  -- tremors and tachycardia with albuterol; changed to xopenex nov 2012 - poor social support: lives alone, confused about medications - echo May 2011: EF 65%, gr 1 diast dysfun and mildly elevated PASP and c/w cor pulmonale - in home hospice since august 2012  # Recurrrent AECOPD - did not tolerate darilesp early 2012 - Nov 2012 OPD Rx - Feb 2013 - - feb 2013 - May 2013  #CLass 4 dyspnea  - morphine causes delirium  #Chronic low platelets  - 80s and 90s - 2012 onwards   OV 10/23/2012  Presents again with caretaker Earleen Reaper  COPD: CAT score 31 and back to May 2013 baseline. Cough, chest tightness, and phlegm improved ut dyspnea, sleep issues and fatigue are really severe and unchnaged. She says fatigue is severe and like a train hit her. She spends all time in bed and is in a vicious cycle of diseaes limitting aDL, causing fatigue limiting ADL. She reluctantly agrees for rehab this visit HGb is 13gm% on 11/6/`3 and stable  New issues: epistaxis x many many months. Insidious onset. Initially thought of as due to o2 but limited to right nostril only. Every few days. Moderate in severity but increasing frequency. Denies associated "digging" of nose. No clear cut aggravating or relieving factors. thre is associated chronic thrombocytopenia. Platelent cough 11//6/13 was baseline 81K; she is due to see Dr Dalene Carrow on fu for this but has been rescheduled. There is also associated hemorrhoids of many decades that is worse past 3 months; does not want to see GI now  Note: labs done at PMD office  GSK, 0 to 5 scale CAT SCORE 04/24/12 09/01/2012  10/23/2012   Never Cough -> Cough all the time 2 5 1   No phlegm in  chest -> Chest is full of phlegm 4 3 2   No chest tightness -> Chest feels very tight 4 4 3   No dyspnea for 1 flight stairs/hill -> Very dyspneic for 1 flight of stairs 5 5 5   No limitations for ADL at home -> Very limited with ADL at home 5 5 5   Confident leaving home -> Not at all confident leaving home 4 5 5   Sleep soundly -> Do not sleep soundly because of lung condition 4 5 5   Lots of Energy -> No energy at all 5 5 5   TOTAL Score (max 40)  33 42 31        Review of Systems  Constitutional: Positive for fatigue. Negative for fever and unexpected weight change.  HENT: Negative for ear pain, nosebleeds, congestion, sore throat, rhinorrhea, sneezing, trouble swallowing, dental problem, postnasal drip and sinus pressure.   Eyes: Negative for redness and itching.  Respiratory: Positive for shortness of breath. Negative for cough, chest tightness and wheezing.   Cardiovascular: Negative for palpitations and leg swelling.  Gastrointestinal: Negative for nausea and vomiting.  Genitourinary: Negative for dysuria.  Musculoskeletal: Negative for joint swelling.  Skin: Negative for rash.  Neurological: Negative for headaches.  Hematological: Does not bruise/bleed easily.  Psychiatric/Behavioral: Negative for dysphoric mood. The patient is not nervous/anxious.    Past, Family, Social reviewed: no change since last visit  Objective:   Physical Exam Vitals reviewed. Constitutional: She is oriented to person, place, and time. She appears well-developed and well-nourished. No distress.       overweight  HENT:  RIGHT NOSTRIL ABRASION + Head: Normocephalic and atraumatic.  Right Ear: External ear normal.  Left Ear: External ear normal.  Mouth/Throat: Oropharynx is clear and moist. No oropharyngeal exudate.  Eyes: Conjunctivae and EOM are normal. Pupils are equal, round, and reactive to light. Right eye exhibits no discharge. Left eye exhibits no discharge. No scleral icterus.  Neck:  Normal range of motion. Neck supple. No JVD present. No tracheal deviation present. No thyromegaly present.  Cardiovascular: Normal rate, regular rhythm, normal heart sounds and intact distal pulses.  Exam reveals no gallop and no friction rub.   No murmur heard. Pulmonary/Chest: Effort normal. No respiratory distress. She has no wheezes. She has no rales. She exhibits no tenderness.       Prolonged expiration and VISIBLY DYSPNEIC AS ALWAYS and PURSE LIP BREATHING + Abdominal: Soft. Bowel sounds are normal. She exhibits no distension and no mass. There is no tenderness. There is no rebound and no guarding.  Musculoskeletal: Normal range of motion. She exhibits no edema and no tenderness.  Lymphadenopathy:    She has no cervical adenopathy.  Neurological: She is alert and oriented to person, place, and time. She has normal reflexes. No cranial nerve deficit. She exhibits normal muscle tone. Coordination normal.  Skin: Skin is warm and dry. No rash noted. She is not diaphoretic. No erythema. No pallor.  Psychiatric: She has a normal mood and affect. Her behavior is normal. Judgment and thought content normal.              Assessment & Plan:

## 2012-10-23 NOTE — Assessment & Plan Note (Signed)
Stable diseaes  Plan contniue current medications and hospice and oxygen

## 2012-10-23 NOTE — Assessment & Plan Note (Signed)
Doubt if related to thrombocytopenia becuas plat count is 81k and adequate. She has local abrasion in her right nostril [place] area. I will refer her to ENT. For now she wants to hold off on GI referral for hemorrhoids

## 2012-10-23 NOTE — Assessment & Plan Note (Signed)
Explained vicious cycle of copd and fatigue. She has reluctantly agreed to attend [facility] rehab

## 2012-10-24 ENCOUNTER — Ambulatory Visit: Payer: Medicare Other | Admitting: Hematology and Oncology

## 2012-10-24 ENCOUNTER — Other Ambulatory Visit: Payer: Medicare Other | Admitting: Lab

## 2012-10-25 ENCOUNTER — Telehealth: Payer: Self-pay | Admitting: Nurse Practitioner

## 2012-10-25 NOTE — Telephone Encounter (Signed)
Called patient- offered appointments today for lab and MD.  Patient unable to come.  States she had appointment with her primary MD yesterday for "incessant nose bleeding" and had labs drawn. RN inquired if pt could request primary MD forward labs to this office?  Pt stated she would.  RN stated will call back with another appointment in the future.

## 2012-11-10 ENCOUNTER — Other Ambulatory Visit: Payer: Self-pay | Admitting: Internal Medicine

## 2012-11-14 ENCOUNTER — Telehealth: Payer: Self-pay | Admitting: Internal Medicine

## 2012-11-14 ENCOUNTER — Other Ambulatory Visit: Payer: Self-pay | Admitting: Internal Medicine

## 2012-11-14 MED ORDER — ARFORMOTEROL TARTRATE 15 MCG/2ML IN NEBU: 15.0000 ug | INHALATION_SOLUTION | Freq: Two times a day (BID) | RESPIRATORY_TRACT | Status: DC

## 2012-11-14 NOTE — Telephone Encounter (Signed)
lmomtcb x1 for Bridget Fry. I spoke with pt and confirmed rx needed was brovana. rx was sent. Nothing further was needed

## 2012-11-15 NOTE — Telephone Encounter (Signed)
[name] returned Mindy's call.  Advised [name] that pt's brovana has been sent to the pharmacy.  [place] Lions verbalized her understanding.

## 2012-11-24 ENCOUNTER — Telehealth: Payer: Self-pay | Admitting: Internal Medicine

## 2012-11-24 NOTE — Telephone Encounter (Signed)
i called and lmomtcb for Bridget Fry.  Attempted to call the pt but the line is busy.  Will try back later.

## 2012-11-27 MED ORDER — AZITHROMYCIN 250 MG PO TABS: 250.0000 mg | ORAL_TABLET | ORAL | Status: DC

## 2012-11-27 MED ORDER — PREDNISONE 10 MG PO TABS: ORAL_TABLET | ORAL | Status: DC

## 2012-11-27 NOTE — Telephone Encounter (Signed)
Pt c/o head congestion, green nasal drainage, PND, sore throat and dry cough x 1 week. Pt states that she normally has 2 Zpaks when this happens and Prednisone taper. She denies any fever, sob or wheezing. Pls advise. Allergies  Allergen Reactions  . Albuterol Sulfate     Increased heart rate  . Codeine     REACTION: nausea  . Morphine And Related   . Tetracycline     REACTION: rash  . Xopenex (Levalbuterol Hydrochloride)     Nebulizer caused nervousness, inhaler ok

## 2012-11-27 NOTE — Telephone Encounter (Signed)
Ok take 2 zpaks and  Please take Take prednisone 40mg  once daily x 3 days, then 30mg  once daily x 3 days, then 20mg  once daily x 3 days, then prednisone 10mg  once daily  x 3 days and stop  For copd exac

## 2012-11-27 NOTE — Telephone Encounter (Signed)
Rxs were sent to [facility] with pt and notified that this was done

## 2012-12-27 ENCOUNTER — Telehealth: Payer: Self-pay | Admitting: Internal Medicine

## 2012-12-27 NOTE — Telephone Encounter (Signed)
MR---hospice wanted to see if you would give the verbal order for her B12 injections since Dr. Dalene Carrow is no longer with [facility] and pt is no longer under her care.  Please advise. thanks

## 2012-12-29 ENCOUNTER — Other Ambulatory Visit: Payer: Self-pay | Admitting: Hematology and Oncology

## 2012-12-29 NOTE — Telephone Encounter (Signed)
Verbal order given to [facility] with Hospice for [place] B12 every 4 weeks per Dr Marchelle Gearing

## 2012-12-29 NOTE — Telephone Encounter (Signed)
Spoke with [place] Lions, patient primary hospice nurse, she does not feel patient needs to be re-assigned at this time. She will speak with patient regarding this matter and will call us if anything should change. Spoke with Jule Ser, who agrees and states that since patient is at home with hospice services covered with Dr Marchelle Gearing, we will discharge from practice. In the event any further services are needed, we can re-establish patient.

## 2012-12-29 NOTE — Telephone Encounter (Signed)
That is fine. They need to tell you the dosing and regiment for [place] to do order

## 2012-12-29 NOTE — Telephone Encounter (Signed)
Bridget Fry returned triage's call.  Advised that Bridget Fry is requesting the dosing.  Bridget Fry states this is a Vitamin b-12 solution, 1000 micrograms every to 1 ml 1000 micrograms given once every 4 wks subcontaniously.    Bridget Fry states if she is w/ a patient, she is not able to pick up the phone.  States she will accept a verbal order for this if we leave it on her secure voice mail.  Bridget Fry

## 2012-12-29 NOTE — Telephone Encounter (Signed)
LMTC x 1  

## 2012-12-29 NOTE — Telephone Encounter (Signed)
Rec'd call from [facility] Pharmacist that patient is there for Vit B12 injection refill but they have no orders. It appears that patient is with [facility] at this time, and an attempt was made to Dr Marchelle Gearing office for orders to continue her monthly injections. Patient was unable to keep appt here at [facility] for reassessment in 10/2012, and has not been rescheduled. Former [facility] administrator, Dr Dalene Carrow no longer at this practice. Patient has not been reassigned to another MD at this time. Will give one time order for refill from MD oncall until we speak with Hospice nurse  Lions, to determine if patient needs to be re-assigned at this time or if patient primary MD is taking care of her needs. If patient is still in need of hematologist, we will be happy to make those arrangements. Message left for [facility] with Hospice regarding this matter.

## 2013-01-03 ENCOUNTER — Telehealth (HOSPITAL_COMMUNITY): Payer: Self-pay | Admitting: *Deleted

## 2013-01-03 NOTE — Telephone Encounter (Signed)
Bridget Fry was contacted by telephone regarding Pulmonary Rehab.  She is declining, states she does not want to get out in the cold weather and she also would have to find someonet to bring her,  She might reconsider in the spring when weather is warmer   Cathie Olden RN

## 2013-01-17 ENCOUNTER — Encounter: Payer: Self-pay | Admitting: Internal Medicine

## 2013-01-17 ENCOUNTER — Ambulatory Visit (INDEPENDENT_AMBULATORY_CARE_PROVIDER_SITE_OTHER): Admitting: Internal Medicine

## 2013-01-17 VITALS — BP 140/80 | HR 87 | Temp 97.4°F | Ht 61.0 in | Wt 176.0 lb

## 2013-01-17 DIAGNOSIS — R040 Epistaxis: Secondary | ICD-10-CM

## 2013-01-17 DIAGNOSIS — J449 Chronic obstructive pulmonary disease, unspecified: Secondary | ICD-10-CM

## 2013-01-17 NOTE — Patient Instructions (Addendum)
#  EPistaxis  -Agree with ENT assessment that this is probably related to oxygen and abrasion from the plastic in the nasal cannula Follow ENT advise    #COPD  - stable disease; continue nebs and prednisone every other day  - Start N. acetylcysteine 600 mg twice daily. You can get this from [facility] store or [email or web address]. This is not to make you feel better but to prevent a number of times he gets flare up  #Followup 3 months or sooner if needed

## 2013-01-17 NOTE — Progress Notes (Signed)
Subjective:    Patient ID: Bridget Fry, female    DOB: 1931/08/24, 79 y.o.   MRN: [id number]  HPI #Advanced COPD , DNAR, DNAI - Fev1 10/15/11:  0.91L/52%    - nasal cannula o2, pred 1mg  daily, budesonide nebs, brovana neb, spiriva, and alb prn  - class 3-4 disabling dyspnea  -- tremors and tachycardia with albuterol; changed to xopenex nov 2012 - poor social support: lives alone, confused about medications - echo May 2011: EF 65%, gr 1 diast dysfun and mildly elevated PASP and c/w cor pulmonale - in home hospice since august 2012  # Recurrrent AECOPD - did not tolerate darilesp early 2012 - Nov 2012 OPD Rx - Feb 2013 - - feb 2013 - May 2013  #CLass 4 dyspnea  - morphine causes delirium  #Chronic low platelets  - 80s and 90s - 2012 onwards   OV 10/23/2012  Presents again with caretaker Earleen Reaper  COPD: CAT score 31 and back to May 2013 baseline. Cough, chest tightness, and phlegm improved ut dyspnea, sleep issues and fatigue are really severe and unchnaged. She says fatigue is severe and like a train hit her. She spends all time in bed and is in a vicious cycle of diseaes limitting aDL, causing fatigue limiting ADL. She reluctantly agrees for rehab this visit HGb is 13gm% on 11/6/`3 and stable  New issues: epistaxis x many many months. Insidious onset. Initially thought of as due to o2 but limited to right nostril only. Every few days. Moderate in severity but increasing frequency. Denies associated "digging" of nose. No clear cut aggravating or relieving factors. thre is associated chronic thrombocytopenia. Platelent cough 11//6/13 was baseline 81K; she is due to see Dr Dalene Carrow on fu for this but has been rescheduled. There is also associated hemorrhoids of many decades that is worse past 3 months; does not want to see GI now  Note: labs done at PMD office   REC #FAtigue  - due to copd and sedantary life; referring you to pulmonary rehab  #EPistaxis  -platelet count is  stable at 81,000; this is not the reason for epistaxis  - I am referring you to ENT for evauation; right nostril looks abraded  #Hemorrhoids  - hold of GI eval as of now  #COPD  - stable disease; continue nebs  - CMA wil ensure you are uptodate with flu shot  #Followup  3 months or sooner if needed   OV 01/17/2013  Followup in stage COPD on hospice care.  She presents with her caretaker. But no new issues. Both she and caretaker test that she is slowly declining. However, CAT score is 31 and unchanged for COPD. Caretaker thinks is more sedentary and spends more time sleeping than compared to a few months ago. She denies any symptoms consistent with acute exacerbation of COPD  GSK, 0 to 5 scale CAT SCORE 04/24/12 09/01/2012  10/23/2012  01/17/2013   Never Cough -> Cough all the time 2 5 1 3   No phlegm in chest -> Chest is full of phlegm 4 3 2 4   No chest tightness -> Chest feels very tight 4 4 3 4   No dyspnea for 1 flight stairs/hill -> Very dyspneic for 1 flight of stairs 5 5 5 5   No limitations for ADL at home -> Very limited with ADL at home 5 5 5 5   Confident leaving home -> Not at all confident leaving home 4 5 5 5   Sleep soundly -> Do not sleep  soundly because of lung condition 4 5 5 5   Lots of Energy -> No energy at all 5 5 5 5   TOTAL Score (max 40)  33 42 31 31    Past, Family, Social reviewed: She has seen here nose throat physician for epistaxis and is felt to be due to trauma from nasal cannula and also dry oxygen. No biopsy was performed per her history. She is using saline rinse. Epistaxis is improved although still there   Review of Systems  Constitutional: Negative for fever and unexpected weight change.  HENT: Negative for ear pain, nosebleeds, congestion, sore throat, rhinorrhea, sneezing, trouble swallowing, dental problem, postnasal drip and sinus pressure.   Eyes: Negative for redness and itching.  Respiratory: Negative for cough, chest tightness, shortness of  breath and wheezing.   Cardiovascular: Negative for palpitations and leg swelling.  Gastrointestinal: Negative for nausea and vomiting.  Genitourinary: Negative for dysuria.  Musculoskeletal: Negative for joint swelling.  Skin: Negative for rash.  Neurological: Negative for headaches.  Hematological: Does not bruise/bleed easily.  Psychiatric/Behavioral: Negative for dysphoric mood. The patient is not nervous/anxious.        Objective:   Physical Exam Vitals reviewed. Constitutional: She is oriented to person, place, and time. She appears well-developed and well-nourished. No distress.       overweight  HENT:  RIGHT NOSTRIL ABRASION + unchanged Head: Normocephalic and atraumatic.  Right Ear: External ear normal.  Left Ear: External ear normal.  Mouth/Throat: Oropharynx is clear and moist. No oropharyngeal exudate.  Eyes: Conjunctivae and EOM are normal. Pupils are equal, round, and reactive to light. Right eye exhibits no discharge. Left eye exhibits no discharge. No scleral icterus.  Neck: Normal range of motion. Neck supple. No JVD present. No tracheal deviation present. No thyromegaly present.  Cardiovascular: Normal rate, regular rhythm, normal heart sounds and intact distal pulses.  Exam reveals no gallop and no friction rub.   No murmur heard. Pulmonary/Chest: Effort normal. No respiratory distress. She has no wheezes. She has no rales. She exhibits no tenderness.       Prolonged expiration and VISIBLY DYSPNEIC AS ALWAYS and PURSE LIP BREATHING + Abdominal: Soft. Bowel sounds are normal. She exhibits no distension and no mass. There is no tenderness. There is no rebound and no guarding.  Musculoskeletal: Normal range of motion. She exhibits no edema and no tenderness.  Lymphadenopathy:    She has no cervical adenopathy.  Neurological: She is alert and oriented to person, place, and time. She has normal reflexes. No cranial nerve deficit. She exhibits normal muscle tone.  Coordination normal.  Skin: Skin is warm and dry. No rash noted. She is not diaphoretic. No erythema. No pallor.  Psychiatric: She has a normal mood and affect. Her behavior is normal. Judgment and thought content normal.                 Assessment & Plan:

## 2013-01-18 NOTE — Assessment & Plan Note (Signed)
#  EPistaxis  -Agree with ENT assessment that this is probably related to oxygen and abrasion from the plastic in the nasal cannula Follow ENT advise

## 2013-01-18 NOTE — Assessment & Plan Note (Signed)
#  COPD  - stable disease; continue nebs and prednisone every other day  - Start N. acetylcysteine 600 mg twice daily. You can get this from [facility] store or [email or web address]. This is not to make you feel better but to prevent a number of times he gets flare up  #Followup 3 months or sooner if needed

## 2013-03-09 ENCOUNTER — Telehealth: Payer: Self-pay | Admitting: Internal Medicine

## 2013-03-09 MED ORDER — AZITHROMYCIN 250 MG PO TABS: 250.0000 mg | ORAL_TABLET | ORAL | Status: DC

## 2013-03-09 NOTE — Telephone Encounter (Signed)
Please have her use [facility] sinus rinse and send script for Zpak with one refill.

## 2013-03-09 NOTE — Telephone Encounter (Addendum)
LMTCB for [employer] and spoke with the pt  She states that just since this am having increased nasal congestion, wheezing or "gurgling", and dry cough She states that she is never able to cough up any mucus Denies any CP, chest tightness, increased SOB, f/c/s or other co's Will forward to Dr Craige Cotta in [facility] absence Pt states if Dr. Craige Cotta prescribes a ZPAK she will need 2 in case one does not work.  Please advise thanks! Allergies  Allergen Reactions  . Albuterol Sulfate     Increased heart rate  . Codeine     REACTION: nausea  . Morphine And Related   . Tetracycline     REACTION: rash  . Xopenex (Levalbuterol Hydrochloride)     Nebulizer caused nervousness, inhaler ok

## 2013-03-09 NOTE — Telephone Encounter (Signed)
Called and lmom for Bridget Fry to make her aware of the zpak with 1 refill has been sent in to the pts pharmacy.  And to make her aware of the sinus rinse that the pt should use.  Advised Bridget Fry to call back for any further recs.

## 2013-03-09 NOTE — Telephone Encounter (Signed)
[name] returned call.  I advised we are waiting to here from [facility].  She stated her understanding and asked that if we do send in an rx for pt to let her know.  If she doesn't answer leave a message. Leanora Ivanoff

## 2013-04-13 ENCOUNTER — Telehealth: Payer: Self-pay | Admitting: Internal Medicine

## 2013-04-13 MED ORDER — AZITHROMYCIN 250 MG PO TABS: ORAL_TABLET | ORAL | Status: DC

## 2013-04-13 NOTE — Telephone Encounter (Signed)
Spoke with [place] [facility], pt's Hospice RN Pt having nasal congestion, sinus HA and sore throat x 3 days Her respiratory symptoms are unchanged  Pt is requesting a zpack with a refill since she normally takes 2 Please advise thanks Allergies  Allergen Reactions  . Albuterol Sulfate     Increased heart rate  . Codeine     REACTION: nausea  . Morphine And Related   . Tetracycline     REACTION: rash  . Xopenex (Levalbuterol Hydrochloride)     Nebulizer caused nervousness, inhaler ok

## 2013-04-13 NOTE — Telephone Encounter (Signed)
MR patient in Hospice for her severe COPD Currently taking brovana, budesonide, spiriva and xopenex hfa prn Last ov was 2.5.14 w/ MR for regular follow up Pt is requesting zpak and has been rx'd this mutltiple times in the past  Per MR's last ov note: Patient Instructions    #EPistaxis  -Agree with ENT assessment that this is probably related to oxygen and abrasion from the plastic in the nasal cannula  Follow ENT advise  #COPD  - stable disease; continue nebs and prednisone every other day  - Start N. acetylcysteine 600 mg twice daily. You can get this from [facility] store or [email or web address]. This is not to make you feel better but to prevent a number of times he gets flare up  #Followup  3 months or sooner if needed

## 2013-04-13 NOTE — Telephone Encounter (Signed)
rx has been sent. lmtcb x1 for Bridget Fry I called pt and made her aware of this

## 2013-04-13 NOTE — Telephone Encounter (Signed)
LMTCB

## 2013-04-13 NOTE — Telephone Encounter (Signed)
Bridget Fry returned call. Bridget Fry

## 2013-04-13 NOTE — Telephone Encounter (Signed)
Pt calling in ref to previous msg wants a call asap because she says she needs to find someone to pick up rx can be reached at [phone].Bridget Fry

## 2013-04-13 NOTE — Telephone Encounter (Signed)
OK per MR to give patient zpak with 2 refills. Carron Curie, CMA

## 2013-04-25 ENCOUNTER — Ambulatory Visit (INDEPENDENT_AMBULATORY_CARE_PROVIDER_SITE_OTHER): Payer: Medicare Other | Admitting: Internal Medicine

## 2013-04-25 ENCOUNTER — Encounter: Payer: Self-pay | Admitting: Internal Medicine

## 2013-04-25 VITALS — BP 138/76 | HR 89 | Ht 61.0 in | Wt 166.0 lb

## 2013-04-25 DIAGNOSIS — J449 Chronic obstructive pulmonary disease, unspecified: Secondary | ICD-10-CM

## 2013-04-25 NOTE — Patient Instructions (Addendum)
#  COPD - COPD appears stable after recent exacerbation for which you got Z-Pak - Continue hospice care, oxygen, nebulizers and N. Acetylcysteine  #Choking on food - We agree that we will keep an eye on it and we'll hold off on a gastroenterology evaluation   #Followup  - 3 months with COPD cat score at followup - Return sooner if needed - Call if any problems

## 2013-04-25 NOTE — Progress Notes (Signed)
Subjective:    Patient ID: Bridget Fry, female    DOB: [birth year], 78 y.o.   MRN: [id number]  HPI  #Advanced COPD , DNAR, DNAI - Fev1 10/15/11:  0.91L/52%    - nasal cannula o2, pred 1mg  daily, budesonide nebs, brovana neb, spiriva, and alb prn  - class 3-4 disabling dyspnea  -- tremors and tachycardia with albuterol; changed to xopenex nov 2012 - poor social support: lives alone, confused about medications - echo May 2011: EF 65%, gr 1 diast dysfun and mildly elevated PASP and c/w cor pulmonale - in home hospice since august 2012 - N. acetylcysteine started February 2014  # Recurrrent AECOPD - did not tolerate darilesp early 2012 - Nov 2012 OPD Rx - Feb 2013 - - feb 2013 - May 2013 - May 2014: Z-Pak called in telephone  #CLass 4 dyspnea  - morphine causes delirium  #Chronic low platelets  - 80s and 90s - 2012 onwards   OV 10/23/2012  Presents again with caretaker Earleen Reaper  COPD: CAT score 31 and back to May 2013 baseline. Cough, chest tightness, and phlegm improved ut dyspnea, sleep issues and fatigue are really severe and unchnaged. She says fatigue is severe and like a train hit her. She spends all time in bed and is in a vicious cycle of diseaes limitting aDL, causing fatigue limiting ADL. She reluctantly agrees for rehab this visit HGb is 13gm% on 11/6/`3 and stable  New issues: epistaxis x many many months. Insidious onset. Initially thought of as due to o2 but limited to right nostril only. Every few days. Moderate in severity but increasing frequency. Denies associated "digging" of nose. No clear cut aggravating or relieving factors. thre is associated chronic thrombocytopenia. Platelent cough 11//6/13 was baseline 81K; she is due to see Dr Dalene Carrow on fu for this but has been rescheduled. There is also associated hemorrhoids of many decades that is worse past 3 months; does not want to see GI now  Note: labs done at PMD office   REC #FAtigue  - due to copd and  sedantary life; referring you to pulmonary rehab  #EPistaxis  -platelet count is stable at 81,000; this is not the reason for epistaxis  - I am referring you to ENT for evauation; right nostril looks abraded  #Hemorrhoids  - hold of GI eval as of now  #COPD  - stable disease; continue nebs  - CMA wil ensure you are uptodate with flu shot  #Followup  3 months or sooner if needed   OV 01/17/2013  Followup end stage COPD on hospice care and daily prednisone   She presents with her caretaker. But no new issues. Both she and caretaker test that she is slowly declining. However, CAT score is 31 and unchanged for COPD. Caretaker thinks is more sedentary and spends more time sleeping than compared to a few months ago. She denies any symptoms consistent with acute exacerbation of COPD  #EPistaxis  -Agree with ENT assessment that this is probably related to oxygen and abrasion from the plastic in the nasal cannula Follow ENT advise    #COPD  - stable disease; continue nebs and prednisone every other day  - Start N. acetylcysteine 600 mg twice daily. You can get this from [facility] store or [email or web address]. This is not to make you feel better but to prevent a number of times he gets flare up  #Followup 3 months or sooner if needed   OV 04/25/2013   Followup for COPD.  -  She continues with hospice care, oxygen, nebulizers. Also started on N. acetylcysteine at last visit to prevent COPD exacerbations. But despite this she had an exacerbation for which we called in a Z-Pak. Today the last day of that course and she is already feeling better. Currently COPD than baseline with class IV dyspnea. Overall, she feels she is declining. She is more fatigued and sleeping and her performance scale has dropped according to her history. However, there have been no admissions.   - Of note, she is reporting some on of dysphagia that is mild. She wants to hold off GI evaluation-  GSK, 0 to 5 scale CAT SCORE  04/24/12 09/01/2012  10/23/2012  01/17/2013   Never Cough -> Cough all the time 2 5 1 3   No phlegm in chest -> Chest is full of phlegm 4 3 2 4   No chest tightness -> Chest feels very tight 4 4 3 4   No dyspnea for 1 flight stairs/hill -> Very dyspneic for 1 flight of stairs 5 5 5 5   No limitations for ADL at home -> Very limited with ADL at home 5 5 5 5   Confident leaving home -> Not at all confident leaving home 4 5 5 5   Sleep soundly -> Do not sleep soundly because of lung condition 4 5 5 5   Lots of Energy -> No energy at all 5 5 5 5   TOTAL Score (max 40)  33 42 31 31    Review of Systems  Constitutional: Negative for fever and unexpected weight change.  HENT: Negative for ear pain,  Review of Systems  Constitutional: Negative for fever and unexpected weight change.  HENT: Positive for congestion, sore throat, rhinorrhea and voice change. Negative for ear pain, nosebleeds, sneezing, trouble swallowing, dental problem, postnasal drip and sinus pressure.   Eyes: Negative for redness and itching.  Respiratory: Positive for cough and shortness of breath. Negative for chest tightness and wheezing.   Cardiovascular: Negative for palpitations and leg swelling.  Gastrointestinal: Negative for nausea and vomiting.  Genitourinary: Negative for dysuria.  Musculoskeletal: Negative for joint swelling.  Skin: Negative for rash.  Neurological: Negative for headaches.  Hematological: Does not bruise/bleed easily.  Psychiatric/Behavioral: Negative for dysphoric mood. The patient is not nervous/anxious.    Current outpatient prescriptions:ACAI PO, Take 1 tablet by mouth daily., Disp: , Rfl: ;  ALOE VERA PO, Take 1 tablet by mouth daily., Disp: , Rfl: ;  arformoterol (BROVANA) 15 MCG/2ML NEBU, Inhale 2 mLs (15 mcg total) into the lungs 2 (two) times daily. Dx: 496, Disp: 120 mL, Rfl: 5;  benzonatate (TESSALON) 200 MG capsule, Take 1 capsule (200 mg total) by mouth 3 (three) times daily as needed for  cough., Disp: 90 capsule, Rfl: 0 BIOTIN PO, Take 1 tablet by mouth daily., Disp: , Rfl: ;  budesonide (PULMICORT) 0.25 MG/2ML nebulizer solution, Take 0.25 mg by nebulization 2 (two) times daily.  , Disp: , Rfl: ;  Calcium Citrate (CITRACAL PO), Take 1 tablet by mouth daily., Disp: , Rfl: ;  cyanocobalamin (,VITAMIN B-12,) 1000 MCG/ML injection, INJECT 1 ML SUB-Q EVERY MONTH, Disp: 1 mL, Rfl: 0 Cyanocobalamin (VITAMIN B-12 IJ), Inject 1,000 Units as directed every 30 (thirty) days. , Disp: , Rfl: ;  dextromethorphan-guaiFENesin (MUCINEX DM) 30-600 MG per 12 hr tablet, Take 1 tablet by mouth every 12 (twelve) hours.  , Disp: , Rfl: ;  diazepam (VALIUM) 5 MG tablet, Take 5 mg by mouth at bedtime as needed., Disp: ,  Rfl: ;  FLUoxetine (PROZAC) 20 MG tablet, Take 20 mg by mouth 2 (two) times daily.  , Disp: , Rfl:  folic acid (FOLVITE) 1 MG tablet, Take 1 mg by mouth daily., Disp: , Rfl: ;  furosemide (LASIX) 40 MG tablet, One tablet daily, Disp: , Rfl: ;  glipiZIDE (GLUCOTROL) 5 MG tablet, Take 5 mg by mouth 2 (two) times daily before a meal. , Disp: , Rfl: ;  HYDROcodone-homatropine (HYCODAN) 5-1.5 MG/5ML syrup, Take 5 mLs by mouth every 6 (six) hours as needed., Disp: 120 mL, Rfl: 0 levalbuterol (XOPENEX HFA) 45 MCG/ACT inhaler, Inhale 2 puffs into the lungs as needed. HOSPICE PATIENT, Disp: , Rfl: ;  meclizine (ANTIVERT) 12.5 MG tablet, Take 12.5 mg by mouth every 4 (four) hours as needed., Disp: , Rfl: ;  meloxicam (MOBIC) 15 MG tablet, Take 15 mg by mouth daily., Disp: , Rfl: ;  NON FORMULARY, Tumeric Tablets twice a day, Disp: , Rfl: ;  pantoprazole (PROTONIX) 40 MG tablet, Take 40 mg by mouth daily.  , Disp: , Rfl:  POLYETHYLENE GLYCOL 3350 PO, Take by mouth. 17 grams daily as needed, Disp: , Rfl: ;  polyethylene glycol powder (GLYCOLAX/MIRALAX) powder, USE 17 GRAMS IN LIQUID EVERY DAY AS NEEDED TO PREVENT CONSTIPATION., Disp: 255 g, Rfl: 3;  potassium chloride (KLOR-CON) 10 MEQ CR tablet, Take 10 mEq by  mouth daily.  , Disp: , Rfl: ;  predniSONE (DELTASONE) 10 MG tablet, 5mg  every other day., Disp: , Rfl:  promethazine (PHENERGAN) 25 MG tablet, 25 mg every 8 (eight) hours as needed. , Disp: , Rfl: ;  sennosides-docusate sodium (SENOKOT-S) 8.6-50 MG tablet, Take 1 tablet by mouth daily., Disp: , Rfl: ;  tiotropium (SPIRIVA) 18 MCG inhalation capsule, Place 18 mcg into inhaler and inhale daily.  , Disp: , Rfl: ;  vitamin B-12 (CYANOCOBALAMIN) 1000 MCG tablet, Take 1,000 mcg by mouth daily., Disp: , Rfl:      Objective:   Physical Exam  Physical Exam Vitals reviewed. Constitutional: She is oriented to person, place, and time. She appears well-developed and well-nourished. No distress.       overweight  HENT:  RIGHT NOSTRIL ABRASION + is improved  Head: Normocephalic and atraumatic.  Right Ear: External ear normal.  Left Ear: External ear normal.  Mouth/Throat: Oropharynx is clear and moist. No oropharyngeal exudate.  Eyes: Conjunctivae and EOM are normal. Pupils are equal, round, and reactive to light. Right eye exhibits no discharge. Left eye exhibits no discharge. No scleral icterus.  Neck: Normal range of motion. Neck supple. No JVD present. No tracheal deviation present. No thyromegaly present.  Cardiovascular: Normal rate, regular rhythm, normal heart sounds and intact distal pulses.  Exam reveals no gallop and no friction rub.   No murmur heard. Pulmonary/Chest: Effort normal. No respiratory distress. She has no wheezes. She has no rales. She exhibits no tenderness.         Prolonged expiration and VISIBLY DYSPNEIC AS ALWAYS and PURSE LIP BREATHING + Abdominal: Soft. Bowel sounds are normal. She exhibits no distension and no mass. There is no tenderness. There is no rebound and no guarding.  Musculoskeletal: Normal range of motion. She exhibits no edema and no tenderness.  Lymphadenopathy:    She has no cervical adenopathy.  Neurological: She is alert and oriented to person, place, and  time. She has normal reflexes. No cranial nerve deficit. She exhibits normal muscle tone. Coordination normal.  Skin: Skin is warm and dry. No rash noted.  She is not diaphoretic. No erythema. No pallor.  Psychiatric: She has a normal mood and affect. Her behavior is normal. Judgment and thought content normal.                 Assessment & Plan:

## 2013-04-27 ENCOUNTER — Other Ambulatory Visit: Payer: Self-pay | Admitting: Internal Medicine

## 2013-05-01 NOTE — Assessment & Plan Note (Signed)
#  COPD - COPD appears stable after recent exacerbation for which you got Z-Pak - Continue hospice care, oxygen, nebulizers and N. Acetylcysteine  #Choking on food - We agree that we will keep an eye on it and we'll hold off on a gastroenterology evaluation   #Followup  - 3 months with COPD cat score at followup - Return sooner if needed - Call if any problems  (> 50% of this 15 min visit spent in face to face counseling)

## 2013-05-18 ENCOUNTER — Telehealth: Payer: Self-pay | Admitting: Internal Medicine

## 2013-05-18 MED ORDER — AZITHROMYCIN 250 MG PO TABS: ORAL_TABLET | ORAL | Status: DC

## 2013-05-18 NOTE — Telephone Encounter (Signed)
Spoke with Angelica Chessman, the pt's Hospice RN She states pt requesting 2 zpacks She is c/o sinus pressure, nasal congestion and green to yellow nasal d/c, and HA x 2 days She states pt wants 2 zpacks  Please advise thanks!

## 2013-05-18 NOTE — Telephone Encounter (Signed)
lmomtcb x1 for mandy--need to confirm pharmacy.

## 2013-05-18 NOTE — Telephone Encounter (Signed)
mandy from hospice called back and she is aware of rx for the zpak per PW.  This has been sent to the pts pharmacy and i called and spoke with pt and she is aware.

## 2013-05-18 NOTE — Telephone Encounter (Signed)
i am ok with Zpak 250mg  Take two once then one daily until gone  (give 12)

## 2013-05-29 ENCOUNTER — Other Ambulatory Visit: Payer: Self-pay | Admitting: Internal Medicine

## 2013-06-22 ENCOUNTER — Telehealth: Payer: Self-pay | Admitting: Internal Medicine

## 2013-06-22 NOTE — Telephone Encounter (Signed)
lmtcb x1 

## 2013-06-25 NOTE — Telephone Encounter (Signed)
Try reducing to 1L o2 for few to several days and if headache still there, reduce to half liter and reassess over few to several days   Dr. Kalman Shan, M.D., [facility].C.P Pulmonary and Critical Care Medicine Staff Physician [place] [facility] [place] Pulmonary and Critical Care Pager: [phone], If no answer or between  15:00h - 7:00h: call [phone]  06/25/2013 10:24 AM

## 2013-06-25 NOTE — Telephone Encounter (Signed)
[name] aware of recs. Nothing further was needed

## 2013-06-25 NOTE — Telephone Encounter (Signed)
I spoke with [place] [facility] and she states Dr. Gibson Ramp went out to see the pt last week and she was c/o having some headaches every morning when she wakes up and Dr. Gibson Ramp wanted to know if MR feels like her oxygen needs to be adjusted during sleep? No other issues at this time. According to chart pt is on 2 liters at bedtime.  Carron Curie, CMA

## 2013-07-10 ENCOUNTER — Telehealth: Payer: Self-pay | Admitting: Internal Medicine

## 2013-07-10 MED ORDER — AZITHROMYCIN 250 MG PO TABS: ORAL_TABLET | ORAL | Status: DC

## 2013-07-10 NOTE — Telephone Encounter (Signed)
rx has been sent. Carron Curie, CMA

## 2013-07-10 NOTE — Telephone Encounter (Signed)
Hospice pt. Pt c/o having increased productive cough with yellow phlegm, increased SOB and per pt hospice nurse said her lungs did not sound good. Pt is requesting an rx for zpak 500mg  with 1 refill. Please advise. Carron Curie, CMA Allergies  Allergen Reactions  . Albuterol Sulfate     Increased heart rate  . Codeine     REACTION: nausea  . Morphine And Related   . Tetracycline     REACTION: rash  . Xopenex (Levalbuterol)     Nebulizer caused nervousness, inhaler ok

## 2013-07-10 NOTE — Telephone Encounter (Signed)
Per SN---ok to give zpak #1  Take as directed with no refills.  i called lois and lmom to make her aware.

## 2013-07-11 ENCOUNTER — Telehealth: Payer: Self-pay | Admitting: Pulmonary Disease

## 2013-07-11 MED ORDER — AZITHROMYCIN 250 MG PO TABS: ORAL_TABLET | ORAL | Status: DC

## 2013-07-11 NOTE — Telephone Encounter (Signed)
I spoke with the pt and advised that the zpak 250mg  is what was recommended. Nothing further needed. Carron Curie, CMA

## 2013-07-11 NOTE — Telephone Encounter (Signed)
I called [place] [facility] and advised of recs and also called the pharmacy and corrected rx. Carron Curie, CMA

## 2013-07-11 NOTE — Telephone Encounter (Signed)
Called, spoke with [place] Lions, pt's hospice nurse, to ensure she was aware of below. [place] [facility] states she did receive msg last night. Reports MR usually gives pt 2 zpaks. Would like clarification on this -- will 1 zpak work or should pt get 2? MR, pls advise.  Thank you.

## 2013-07-11 NOTE — Telephone Encounter (Signed)
2 x ZPAK ok, give 4 refills of the 2 courses   Dr. Kalman Shan, M.D., [facility].C.P Pulmonary and Critical Care Medicine Staff Physician  System [place] Pulmonary and Critical Care Pager: [phone], If no answer or between  15:00h - 7:00h: call [phone]  07/11/2013 9:52 AM

## 2013-08-03 ENCOUNTER — Other Ambulatory Visit: Payer: Self-pay | Admitting: Internal Medicine

## 2013-08-07 ENCOUNTER — Telehealth: Payer: Self-pay | Admitting: Internal Medicine

## 2013-08-07 NOTE — Telephone Encounter (Signed)
LMTCB for [employer]

## 2013-08-08 ENCOUNTER — Ambulatory Visit: Payer: Medicare Other | Admitting: Internal Medicine

## 2013-08-08 NOTE — Telephone Encounter (Signed)
Home symptoms mgmt not possible. She can go see PCP Georgann Housekeeper, MD   Dr. Kalman Shan, M.D., [facility].C.P Pulmonary and Critical Care Medicine Staff Physician [place] [facility] [place] Pulmonary and Critical Care Pager: [phone], If no answer or between  15:00h - 7:00h: call [phone]  08/08/2013 12:04 PM

## 2013-08-08 NOTE — Telephone Encounter (Signed)
I called and spoke with [place] [facility] and made her aware. Nothing further needed

## 2013-08-08 NOTE — Telephone Encounter (Signed)
Spoke with [facility] with Hospice.  Pt is having increased trouble with abd bloating, abd pain, increased gas, green stools and nausea.  Pt does not want to go to hospital.  [place] [facility] has concerns of possible gallbladder or cdiff but not sure.  MR is attending through Hospice.  Please advise.

## 2013-08-24 ENCOUNTER — Other Ambulatory Visit (INDEPENDENT_AMBULATORY_CARE_PROVIDER_SITE_OTHER): Payer: Medicare Other

## 2013-08-24 ENCOUNTER — Ambulatory Visit (INDEPENDENT_AMBULATORY_CARE_PROVIDER_SITE_OTHER)
Admission: RE | Admit: 2013-08-24 | Discharge: 2013-08-24 | Disposition: A | Discharge status: Home / Self Care | Source: Ambulatory Visit | Attending: Internal Medicine | Admitting: Internal Medicine

## 2013-08-24 ENCOUNTER — Telehealth: Payer: Self-pay | Admitting: Internal Medicine

## 2013-08-24 ENCOUNTER — Ambulatory Visit (INDEPENDENT_AMBULATORY_CARE_PROVIDER_SITE_OTHER): Payer: Medicare Other | Admitting: Internal Medicine

## 2013-08-24 ENCOUNTER — Encounter: Payer: Self-pay | Admitting: Internal Medicine

## 2013-08-24 VITALS — BP 110/64 | HR 79 | Temp 97.5°F | Ht 60.0 in | Wt 159.2 lb

## 2013-08-24 DIAGNOSIS — J449 Chronic obstructive pulmonary disease, unspecified: Secondary | ICD-10-CM

## 2013-08-24 DIAGNOSIS — R109 Unspecified abdominal pain: Secondary | ICD-10-CM

## 2013-08-24 DIAGNOSIS — W19XXXA Unspecified fall, initial encounter: Secondary | ICD-10-CM

## 2013-08-24 DIAGNOSIS — R296 Repeated falls: Secondary | ICD-10-CM | POA: Insufficient documentation

## 2013-08-24 LAB — PHOSPHORUS: Phosphorus: 2.6 mg/dL (ref 2.3–4.6)

## 2013-08-24 LAB — CBC WITH DIFFERENTIAL/PLATELET
Basophils Absolute: 0 10*3/uL (ref 0.0–0.1)
Eosinophils Relative: 3.6 % (ref 0.0–5.0)
HCT: 35.4 % — ABNORMAL LOW (ref 36.0–46.0)
Hemoglobin: 11.7 g/dL — ABNORMAL LOW (ref 12.0–15.0)
Lymphs Abs: 0.5 10*3/uL — ABNORMAL LOW (ref 0.7–4.0)
Monocytes Relative: 7.6 % (ref 3.0–12.0)
Neutro Abs: 3.3 10*3/uL (ref 1.4–7.7)
RDW: 16.2 % — ABNORMAL HIGH (ref 11.5–14.6)

## 2013-08-24 LAB — BASIC METABOLIC PANEL
BUN: 14 mg/dL (ref 6–23)
Chloride: 97 mEq/L (ref 96–112)
Glucose, Bld: 185 mg/dL — ABNORMAL HIGH (ref 70–99)
Potassium: 4.1 mEq/L (ref 3.5–5.1)

## 2013-08-24 NOTE — Telephone Encounter (Signed)
I spoke with patient about results and he verbalized understanding and had no questions. She wants this mailed to her and I have done so. Nothing further needed

## 2013-08-24 NOTE — Progress Notes (Signed)
Subjective:    Patient ID: Bridget Fry, female    DOB: 1931-08-11, 79 y.o.   MRN: [id number]  HPI #Advanced COPD , DNAR, DNAI - Fev1 10/15/11:  0.91L/52%    - nasal cannula o2, pred 1mg  daily, budesonide nebs, brovana neb, spiriva, and alb prn  - class 3-4 disabling dyspnea  -- tremors and tachycardia with albuterol; changed to xopenex nov 2012 - poor social support: lives alone, confused about medications - echo May 2011: EF 65%, gr 1 diast dysfun and mildly elevated PASP and c/w cor pulmonale - in home hospice since august 2012 - N. acetylcysteine started February 2014  # Recurrrent AECOPD - did not tolerate darilesp early 2012 - Nov 2012 OPD Rx - Feb 2013 - - feb 2013 - May 2013 - May 2014: Z-Pak called in telephone  #CLass 4 dyspnea  - morphine causes delirium  #Chronic low platelets  - 80s and 90s - 2012 onwards   OV 10/23/2012  Presents again with caretaker Bridget Fry  COPD: CAT score 31 and back to May 2013 baseline. Cough, chest tightness, and phlegm improved ut dyspnea, sleep issues and fatigue are really severe and unchnaged. She says fatigue is severe and like a train hit her. She spends all time in bed and is in a vicious cycle of diseaes limitting aDL, causing fatigue limiting ADL. She reluctantly agrees for rehab this visit HGb is 13gm% on 11/6/`3 and stable  New issues: epistaxis x many many months. Insidious onset. Initially thought of as due to o2 but limited to right nostril only. Every few days. Moderate in severity but increasing frequency. Denies associated "digging" of nose. No clear cut aggravating or relieving factors. thre is associated chronic thrombocytopenia. Platelent cough 11//6/13 was baseline 81K; she is due to see Dr Dalene Carrow on fu for this but has been rescheduled. There is also associated hemorrhoids of many decades that is worse past 3 months; does not want to see GI now  Note: labs done at PMD office   REC #FAtigue  - due to copd and  sedantary life; referring you to pulmonary rehab  #EPistaxis  -platelet count is stable at 81,000; this is not the reason for epistaxis  - I am referring you to ENT for evauation; right nostril looks abraded  #Hemorrhoids  - hold of GI eval as of now  #COPD  - stable disease; continue nebs  - CMA wil ensure you are uptodate with flu shot  #Followup  3 months or sooner if needed   OV 01/17/2013  Followup end stage COPD on hospice care and daily prednisone   She presents with her caretaker. But no new issues. Both she and caretaker test that she is slowly declining. However, CAT score is 31 and unchanged for COPD. Caretaker thinks is more sedentary and spends more time sleeping than compared to a few months ago. She denies any symptoms consistent with acute exacerbation of COPD  #EPistaxis  -Agree with ENT assessment that this is probably related to oxygen and abrasion from the plastic in the nasal cannula Follow ENT advise    #COPD  - stable disease; continue nebs and prednisone every other day  - Start N. acetylcysteine 600 mg twice daily. You can get this from [facility] store or [email or web address]. This is not to make you feel better but to prevent a number of times he gets flare up  #Followup 3 months or sooner if needed   OV 04/25/2013   Followup for COPD.  -  She continues with hospice care, oxygen, nebulizers. Also started on N. acetylcysteine at last visit to prevent COPD exacerbations. But despite this she had an exacerbation for which we called in a Z-Pak. Today the last day of that course and she is already feeling better. Currently COPD than baseline with class IV dyspnea. Overall, she feels she is declining. She is more fatigued and sleeping and her performance scale has dropped according to her history. However, there have been no admissions.   - Of note, she is reporting some on of dysphagia that is mild. She wants to hold off GI evaluation-   OV 08/24/2013 - ACUTE  VISIT  Acute visit for this patient with COPD on home hospice  She does schedule followup apparently for next week but she is here acutely for nonpulmonary issues that have developed over the last few weeks. According to her and her caretaker Bridget Fry she's fallen 2 times. One of these times she came out of the bathroom and just fell. There was no syncope or associated trip and fall. Unclear why she fell. She was not unconscious or groggy or in COPD exacerbation or having chest pain at that time.  Also for the last several weeks she's had increased abdominal distention or bloating, nonspecific abdominal pain but no nausea, vomiting, diarrhea, constipation, fever, chills. Pain is sometimes severe but periodically moderate. It is intermittent and crampy sensation. She very poor historian and unable to pinpoint anything else about this pain. There is no radiation. She knows that she is to follow Dr. Eula Listen her primary care physician for this but she wants me to check this out although I have reminded her that I'm her pulmonologist  GSK, 0 to 5 scale CAT SCORE 04/24/12 09/01/2012  10/23/2012  01/17/2013   Never Cough -> Cough all the time 2 5 1 3   No phlegm in chest -> Chest is full of phlegm 4 3 2 4   No chest tightness -> Chest feels very tight 4 4 3 4   No dyspnea for 1 flight stairs/hill -> Very dyspneic for 1 flight of stairs 5 5 5 5   No limitations for ADL at home -> Very limited with ADL at home 5 5 5 5   Confident leaving home -> Not at all confident leaving home 4 5 5 5   Sleep soundly -> Do not sleep soundly because of lung condition 4 5 5 5   Lots of Energy -> No energy at all 5 5 5 5   TOTAL Score (max 40)  33 42 31 31      Review of Systems  Constitutional: Positive for chills. Negative for fever and unexpected weight change.  HENT: Positive for congestion, sore throat, sneezing, trouble swallowing and postnasal drip. Negative for ear pain, nosebleeds, rhinorrhea, dental problem and sinus  pressure.   Eyes: Negative for redness and itching.  Respiratory: Positive for cough, chest tightness, shortness of breath and wheezing.   Cardiovascular: Negative for palpitations and leg swelling.  Gastrointestinal: Positive for nausea. Negative for vomiting.  Genitourinary: Negative for dysuria.  Musculoskeletal: Negative for joint swelling.  Skin: Negative for rash.  Neurological: Positive for headaches.  Hematological: Does not bruise/bleed easily.  Psychiatric/Behavioral: Negative for dysphoric mood. The patient is not nervous/anxious.        Objective:   Physical Exam     Physical Exam Vitals reviewed. Constitutional: She is oriented to person, place, and time. She appears well-developed and well-nourished. No distress.       overweight  HENT:  Head: Normocephalic and atraumatic.  Right Ear: External ear normal.  Left Ear: External ear normal.  Mouth/Throat: Oropharynx is clear and moist. No oropharyngeal exudate.  Eyes: Conjunctivae and EOM are normal. Pupils are equal, round, and reactive to light. Right eye exhibits no discharge. Left eye exhibits no discharge. No scleral icterus.  Neck: Normal range of motion. Neck supple. No JVD present. No tracheal deviation present. No thyromegaly present.  Cardiovascular: Normal rate, regular rhythm, normal heart sounds and intact distal pulses.  Exam reveals no gallop and no friction rub.   No murmur heard. Pulmonary/Chest: Effort normal. No respiratory distress. She has no wheezes. She has no rales. She exhibits no tenderness.         Prolonged expiration and VISIBLY DYSPNEIC AS ALWAYS and PURSE LIP BREATHING + Abdominal: Soft. Bowel sounds are normal. Abdomen looks distended but seems more from obesity with everted umbilicus. To me this looks baseline but she and her caretaker states more distended than usual. She does have some tenderness around her umbilicus possibly is  a hernia  Musculoskeletal: Normal range of motion. She  exhibits no edema and no tenderness.  Lymphadenopathy:    She has no cervical adenopathy.  Neurological: She is alert and oriented to person, place, and time. She has normal reflexes. No cranial nerve deficit. She exhibits normal muscle tone. Coordination normal.  Skin: Skin is warm and dry. No rash noted. She is not diaphoretic. No erythema. No pallor.  Psychiatric: She has a normal mood and affect. Her behavior is normal. Judgment and thought content normal.                    Assessment & Plan:

## 2013-08-24 NOTE — Telephone Encounter (Signed)
Let her know other than chronic changes in her cbc, chemistry is ok  AXR - nil acute  She has to follow with Sedalia Muta, MD   Dr. Kalman Shan, M.D., [facility].C.P Pulmonary and Critical Care Medicine Staff Physician [place] [facility]  Pulmonary and Critical Care Pager: [phone], If no answer or between  15:00h - 7:00h: call [phone]  08/24/2013 4:49 PM      Dg Abd 1 View  08/24/2013   CLINICAL DATA:  79 year old female abdominal pain distension nausea.  EXAM: ABDOMEN - 1 VIEW  COMPARISON:  CT Abdomen and Pelvis 08/06/2009.  FINDINGS: Chronic Aortoiliac calcified atherosclerosis noted. Non obstructed bowel gas pattern. Increased volume of retained stool in the distal colon abdominal and pelvic visceral contours otherwise are stable. No acute osseous abnormality identified.  IMPRESSION: Non obstructed bowel gas pattern.   Electronically Signed   By: Augusto Gamble M.D.   On: 08/24/2013 14:55

## 2013-08-24 NOTE — Patient Instructions (Addendum)
#  Abd pain Do blood work and xray; will call with results FOllow with Dr Rene Paci  IF symptoms get worse, go to ER  #Falls Let us see if blood work shows anything specific regarding falls  #COPD - stable - continue nebs - you can have your flu shot 08/24/2013 if you want  #Followu 3 months Return sooner if neeed

## 2013-08-24 NOTE — Assessment & Plan Note (Addendum)
She's fallen at home x2. Unclear cause. May be age-related mobility issues. Does not sound like it was related to hypercapnia. Will see if bmet shows raised coo2 or low mag or phos

## 2013-08-24 NOTE — Assessment & Plan Note (Signed)
Stable disease, we'll offer flu shot. Continue her nebulizer and oxygen with symptom management according to hospice care

## 2013-08-24 NOTE — Assessment & Plan Note (Signed)
She is concerned about cholecystitis. Pain is too vague and does not localize. The area around her umbilicus is a bit tender but I do not see any evidence of intestinal obstruction. I will check basic labs including lipase and lactic acid and an abdominal x-ray. I have told her to go to the emergency department if worse. Her primary care physician Dr. Eula Listen to follow this issue

## 2013-08-27 ENCOUNTER — Ambulatory Visit: Payer: Medicare Other | Admitting: Internal Medicine

## 2013-10-26 ENCOUNTER — Telehealth: Payer: Self-pay | Admitting: Internal Medicine

## 2013-10-26 MED ORDER — AZITHROMYCIN 250 MG PO TABS: ORAL_TABLET | ORAL | Status: DC

## 2013-10-26 NOTE — Telephone Encounter (Signed)
Give 2 roudns zpak   Dr. Kalman Shan, M.D., [facility].C.P Pulmonary and Critical Care Medicine Staff Physician [place] [facility] [place] Pulmonary and Critical Care Pager: [phone], If no answer or between  15:00h - 7:00h: call [phone]  10/26/2013 4:03 PM

## 2013-10-26 NOTE — Telephone Encounter (Signed)
Called, spoke with [place] [facility], pt's hospice nurse.  Reports pt has wheezing (new), cough - loose but nonprod, and congestion.  SOB is at baseline.  No f/c/c.  States zpak usually helps but pt usually needs 2 rounds.  MR, pls advise.  Thank you.  [employer]  ** When [employer] back, she requests [place] to leave a detailed msg on her # if she doesn't answer AND call pt.

## 2013-10-26 NOTE — Telephone Encounter (Signed)
lmomtcb x1 for Bridget Fry--rx has been sent

## 2013-10-29 NOTE — Telephone Encounter (Signed)
[name] advised. Carron Curie, CMA

## 2013-11-21 ENCOUNTER — Ambulatory Visit (INDEPENDENT_AMBULATORY_CARE_PROVIDER_SITE_OTHER): Payer: Medicare Other | Admitting: Internal Medicine

## 2013-11-21 ENCOUNTER — Encounter: Payer: Self-pay | Admitting: Internal Medicine

## 2013-11-21 VITALS — BP 140/80 | HR 80 | Ht 61.0 in | Wt 163.0 lb

## 2013-11-21 DIAGNOSIS — J449 Chronic obstructive pulmonary disease, unspecified: Secondary | ICD-10-CM

## 2013-11-21 DIAGNOSIS — J4489 Other specified chronic obstructive pulmonary disease: Secondary | ICD-10-CM

## 2013-11-21 MED ORDER — AZITHROMYCIN 250 MG PO TABS: ORAL_TABLET | ORAL | Status: DC

## 2013-11-21 NOTE — Assessment & Plan Note (Signed)
#  COPD - stable without fare up but you are in state of steady decline as evidenced by more time in bed and increase in falls - Risk of dying next 6 months is very high - continue nebs -  Take zprak 10 day course with 3 refills  #Followu 3 months Return sooner if neeed

## 2013-11-21 NOTE — Progress Notes (Signed)
Subjective:    Patient ID: Bridget Fry, female    DOB: 29-Jun-1931, 79 y.o.   MRN: [id number]  HPI    #Advanced COPD , DNAR, DNAI - Fev1 10/15/11:  0.91L/52%    - nasal cannula o2, pred 1mg  daily, budesonide nebs, brovana neb, spiriva, and alb prn  - class 3-4 disabling dyspnea  -- tremors and tachycardia with albuterol; changed to xopenex nov 2012 - poor social support: lives alone, confused about medications - echo May 2011: EF 65%, gr 1 diast dysfun and mildly elevated PASP and c/w cor pulmonale - in home hospice since august 2012 - N. acetylcysteine started February 2014  # Recurrrent AECOPD - did not tolerate darilesp early 2012 - Nov 2012 OPD Rx - Feb 2013 - - feb 2013 - May 2013 - May 2014: Z-Pak called in telephone  #CLass 4 dyspnea  - morphine causes delirium  #Chronic low platelets  - 80s and 90s - 2012 onwards   OV 10/23/2012  Presents again with caretaker Earleen Reaper  COPD: CAT score 31 and back to May 2013 baseline. Cough, chest tightness, and phlegm improved ut dyspnea, sleep issues and fatigue are really severe and unchnaged. She says fatigue is severe and like a train hit her. She spends all time in bed and is in a vicious cycle of diseaes limitting aDL, causing fatigue limiting ADL. She reluctantly agrees for rehab this visit HGb is 13gm% on 11/6/`3 and stable  New issues: epistaxis x many many months. Insidious onset. Initially thought of as due to o2 but limited to right nostril only. Every few days. Moderate in severity but increasing frequency. Denies associated "digging" of nose. No clear cut aggravating or relieving factors. thre is associated chronic thrombocytopenia. Platelent cough 11//6/13 was baseline 81K; she is due to see Dr Dalene Carrow on fu for this but has been rescheduled. There is also associated hemorrhoids of many decades that is worse past 3 months; does not want to see GI now  Note: labs done at PMD office   REC #FAtigue  - due to  copd and sedantary life; referring you to pulmonary rehab  #EPistaxis  -platelet count is stable at 81,000; this is not the reason for epistaxis  - I am referring you to ENT for evauation; right nostril looks abraded  #Hemorrhoids  - hold of GI eval as of now  #COPD  - stable disease; continue nebs  - CMA wil ensure you are uptodate with flu shot  #Followup  3 months or sooner if needed   OV 01/17/2013  Followup end stage COPD on hospice care and daily prednisone   She presents with her caretaker. But no new issues. Both she and caretaker test that she is slowly declining. However, CAT score is 31 and unchanged for COPD. Caretaker thinks is more sedentary and spends more time sleeping than compared to a few months ago. She denies any symptoms consistent with acute exacerbation of COPD  #EPistaxis  -Agree with ENT assessment that this is probably related to oxygen and abrasion from the plastic in the nasal cannula Follow ENT advise    #COPD  - stable disease; continue nebs and prednisone every other day  - Start N. acetylcysteine 600 mg twice daily. You can get this from [facility] store or [email or web address]. This is not to make you feel better but to prevent a number of times he gets flare up  #Followup 3 months or sooner if needed   OV 04/25/2013   Followup  for COPD.  - She continues with hospice care, oxygen, nebulizers. Also started on N. acetylcysteine at last visit to prevent COPD exacerbations. But despite this she had an exacerbation for which we called in a Z-Pak. Today the last day of that course and she is already feeling better. Currently COPD than baseline with class IV dyspnea. Overall, she feels she is declining. She is more fatigued and sleeping and her performance scale has dropped according to her history. However, there have been no admissions.   - Of note, she is reporting some on of dysphagia that is mild. She wants to hold off GI evaluation-   OV 08/24/2013 - ACUTE  VISIT  Acute visit for this patient with COPD on home hospice  She does schedule followup apparently for next week but she is here acutely for nonpulmonary issues that have developed over the last few weeks. According to her and her caretaker [name] she's fallen 2 times. One of these times she came out of the bathroom and just fell. There was no syncope or associated trip and fall. Unclear why she fell. She was not unconscious or groggy or in COPD exacerbation or having chest pain at that time.  Also for the last several weeks she's had increased abdominal distention or bloating, nonspecific abdominal pain but no nausea, vomiting, diarrhea, constipation, fever, chills. Pain is sometimes severe but periodically moderate. It is intermittent and crampy sensation. She very poor historian and unable to pinpoint anything else about this pain. There is no radiation. She knows that she is to follow Dr. Eula Listen her primary care physician for this but she wants me to check this out although I have reminded her that I'm her pulmonologist   OV 11/21/2013  Followup Gold D. COPD on hospice care.  Since last seen her 3 months ago. She's had progressive decline in her effort tolerance. She called and was a COPD exacerbation. She spends most of the day in bed. The only time outside the house is when she makes a doctor visit. Reviewed hospice plan Notes from recently had a notes that her performance scale is dropped. She's worried that she slowly dying. She wants Z-Pak refills. She already had her flu shot  GSK, 0 to 5 scale CAT SCORE 04/24/12 09/01/2012  10/23/2012  01/17/2013   Never Cough -> Cough all the time 2 5 1 3   No phlegm in chest -> Chest is full of phlegm 4 3 2 4   No chest tightness -> Chest feels very tight 4 4 3 4   No dyspnea for 1 flight stairs/hill -> Very dyspneic for 1 flight of stairs 5 5 5 5   No limitations for ADL at home -> Very limited with ADL at home 5 5 5 5   Confident leaving home ->  Not at all confident leaving home 4 5 5 5   Sleep soundly -> Do not sleep soundly because of lung condition 4 5 5 5   Lots of Energy -> No energy at all 5 5 5 5   TOTAL Score (max 40)  33 42 31 31    Review of Systems  Constitutional: Negative for fever and unexpected weight change.  HENT: Negative for congestion, dental problem, ear pain, nosebleeds, postnasal drip, rhinorrhea, sinus pressure, sneezing, sore throat and trouble swallowing.   Eyes: Negative for redness and itching.  Respiratory: Positive for cough, chest tightness and shortness of breath. Negative for wheezing.   Cardiovascular: Negative for palpitations and leg swelling.  Gastrointestinal: Negative for nausea and  vomiting.  Genitourinary: Negative for dysuria.  Musculoskeletal: Negative for joint swelling.  Skin: Negative for rash.  Neurological: Negative for headaches.  Hematological: Does not bruise/bleed easily.  Psychiatric/Behavioral: Negative for dysphoric mood. The patient is not nervous/anxious.        Objective:   Physical Exam      Physical Exam Vitals reviewed. Constitutional: She is oriented to person, place, and time. She appears well-developed and well-nourished. No distress.       overweight  HENT: Head: Normocephalic and atraumatic.  Right Ear: External ear normal.  Left Ear: External ear normal.  Mouth/Throat: Oropharynx is clear and moist. No oropharyngeal exudate.  Eyes: Conjunctivae and EOM are normal. Pupils are equal, round, and reactive to light. Right eye exhibits no discharge. Left eye exhibits no discharge. No scleral icterus.  Neck: Normal range of motion. Neck supple. No JVD present. No tracheal deviation present. No thyromegaly present.  Cardiovascular: Normal rate, regular rhythm, normal heart sounds and intact distal pulses.  Exam reveals no gallop and no friction rub.   No murmur heard. Pulmonary/Chest: Effort normal. No respiratory distress. She has no wheezes. She has no rales. She  exhibits no tenderness.         Prolonged expiration and VISIBLY DYSPNEIC AS ALWAYS and PURSE LIP BREATHING + Abdominal: Soft. Bowel sounds are normal. Abdomen looks distended but seems more from obesity with everted umbilicus.  She does have some tenderness non speicific  Musculoskeletal: Normal range of motion. She exhibits no edema and no tenderness.  Lymphadenopathy:    She has no cervical adenopathy.  Neurological: She is alert and oriented to person, place, and time. She has normal reflexes. No cranial nerve deficit. She exhibits normal muscle tone. Coordination normal.  Skin: Skin is warm and dry. No rash noted. She is not diaphoretic. No erythema. No pallor.  Psychiatric: She has a normal mood and affect. Her behavior is normal. Judgment and thought content normal.       Assessment & Plan:

## 2013-11-27 ENCOUNTER — Other Ambulatory Visit: Payer: Self-pay | Admitting: Internal Medicine

## 2013-11-28 ENCOUNTER — Telehealth: Payer: Self-pay | Admitting: Internal Medicine

## 2013-11-28 MED ORDER — CYANOCOBALAMIN 1000 MCG/ML IJ SOLN: INTRAMUSCULAR | Status: DC

## 2013-11-28 NOTE — Telephone Encounter (Signed)
Spoke with Bridget Fry, okay to refill as MR is attending for hospice Called spoke with patient and verified that requested medication -- B12 injection Q30days.   Pt is asking for multiple refills Pt stated she does not take B12 tablets, will remove from med list Rx sent to verified pharmacy

## 2013-12-21 ENCOUNTER — Other Ambulatory Visit: Payer: Self-pay | Admitting: Internal Medicine

## 2013-12-21 ENCOUNTER — Telehealth: Payer: Self-pay | Admitting: Internal Medicine

## 2013-12-21 NOTE — Telephone Encounter (Signed)
Called and spoke with pt and she is aware that refill has been sent to her pharmacy.  Nothing further is needed.

## 2014-02-12 ENCOUNTER — Telehealth: Payer: Self-pay | Admitting: Internal Medicine

## 2014-02-12 MED ORDER — ARFORMOTEROL TARTRATE 15 MCG/2ML IN NEBU: 15.0000 ug | INHALATION_SOLUTION | Freq: Two times a day (BID) | RESPIRATORY_TRACT | Status: DC

## 2014-02-12 NOTE — Telephone Encounter (Signed)
Rx has been sent to [facility].

## 2014-02-13 ENCOUNTER — Telehealth: Payer: Self-pay | Admitting: Internal Medicine

## 2014-02-13 MED ORDER — ARFORMOTEROL TARTRATE 15 MCG/2ML IN NEBU: 15.0000 ug | INHALATION_SOLUTION | Freq: Two times a day (BID) | RESPIRATORY_TRACT | Status: DC

## 2014-02-13 NOTE — Telephone Encounter (Signed)
Called spoke with pt. She is having problems with brovana. Called [facility]. Was told rx not due or refill until 02/16/14. I called and made pt aware. She voiced her understanding and needed nothing further

## 2014-02-25 ENCOUNTER — Ambulatory Visit (INDEPENDENT_AMBULATORY_CARE_PROVIDER_SITE_OTHER): Payer: Medicare Other | Admitting: Internal Medicine

## 2014-02-25 VITALS — BP 112/68 | HR 94 | Ht 61.0 in | Wt 165.0 lb

## 2014-02-25 DIAGNOSIS — E538 Deficiency of other specified B group vitamins: Secondary | ICD-10-CM

## 2014-02-25 DIAGNOSIS — J9610 Chronic respiratory failure, unspecified whether with hypoxia or hypercapnia: Secondary | ICD-10-CM

## 2014-02-25 MED ORDER — BUDESONIDE 0.25 MG/2ML IN SUSP: 0.2500 mg | Freq: Two times a day (BID) | RESPIRATORY_TRACT | Status: DC

## 2014-02-25 MED ORDER — LEVALBUTEROL TARTRATE 45 MCG/ACT IN AERO: 2.0000 | INHALATION_SPRAY | RESPIRATORY_TRACT | Status: DC | PRN

## 2014-02-25 NOTE — Patient Instructions (Signed)
Copd itself is stable You are more short of breath from severe anemia Plesae have Dr Clent RidgesBuccinni office call me if they are considering procedures; you need it under anesthesia supervision  REturn 6 weeks or sooner if needed

## 2014-02-25 NOTE — Progress Notes (Signed)
Subjective:    Patient ID: Bridget Fry, female    DOB: 1931/05/27, 80 y.o.   MRN: [id number]  HPI    #Advanced COPD , DNAR, DNAI - Fev1 10/15/11:  0.91L/52%    - nasal cannula o2, pred 1mg  daily, budesonide nebs, brovana neb, spiriva, and alb prn  - class 3-4 disabling dyspnea  -- tremors and tachycardia with albuterol; changed to xopenex nov 2012 - poor social support: lives alone, confused about medications - echo May 2011: EF 65%, gr 1 diast dysfun and mildly elevated PASP and c/w cor pulmonale - in home hospice since august 2012 - N. acetylcysteine started February 2014  # Recurrrent AECOPD - did not tolerate darilesp early 2012 - Nov 2012 OPD Rx - Feb 2013 - - feb 2013 - May 2013 - May 2014: Z-Pak called in telephone  #CLass 4 dyspnea  - morphine causes delirium  #Chronic low platelets  - 80s and 90s - 2012 onwards   OV  02/25/2014 Chief Complaint  Patient presents with  . COPD    follow-up. Pt states she is having increased SOB. She is having to use her rescue inhaler more often.    For COPD with chronic respiratory failure, DNA R., DO NOT INTUBATE in home hospice for COPD. Since her last visit in December 2014 she's had progressive dyspnea on exertion along with dizziness and fatigue. 02/20/2014 she saw primary care physician and has worsening anemia or new onset anemia. Hemoglobin is some 7.8 g percent with a mean corpuscular volume of 66.2 suggesting microcytic anemia. She is on iron pills since this but she has been having black stools even before starting iron pills . She has a followup in the next few days with  gastroenterologist. There no overt symptoms of COPD exacerbation. COPD cat score is 40 and an all-time high in terms of COPD symptoms. She's afraid about having an endoscopy or colonoscopy and is afraid of anesthesia risk given her advanced COPD and non-resuscitative CODE STATUS  COPD she continues on nebulizer and chronic prednisone  GSK, 0 to 5  scale CAT SCORE 04/24/12 09/01/2012  10/23/2012  01/17/2013  02/25/2014   Never Cough -> Cough all the time 2 5 1 3 2   No phlegm in chest -> Chest is full of phlegm 4 3 2 4 3   No chest tightness -> Chest feels very tight 4 4 3 4 5   No dyspnea for 1 flight stairs/hill -> Very dyspneic for 1 flight of stairs 5 5 5 5 5   No limitations for ADL at home -> Very limited with ADL at home 5 5 5 5 5   Confident leaving home -> Not at all confident leaving home 4 5 5 5 5   Sleep soundly -> Do not sleep soundly because of lung condition 4 5 5 5 5   Lots of Energy -> No energy at all 5 5 5 5 5   TOTAL Score (max 40)  33 42 31 31 40    Review of Systems  Constitutional: Negative for fever and unexpected weight change.  HENT: Negative for congestion, dental problem, ear pain, nosebleeds, postnasal drip, rhinorrhea, sinus pressure, sneezing, sore throat and trouble swallowing.   Eyes: Negative for redness and itching.  Respiratory: Positive for shortness of breath. Negative for cough, chest tightness and wheezing.   Cardiovascular: Negative for palpitations and leg swelling.  Gastrointestinal: Negative for nausea and vomiting.  Genitourinary: Negative for dysuria.  Musculoskeletal: Negative for joint swelling.  Skin: Negative for  rash.  Neurological: Negative for headaches.  Hematological: Does not bruise/bleed easily.  Psychiatric/Behavioral: Negative for dysphoric mood. The patient is not nervous/anxious.        Objective:   Physical Exam  Vitals reviewed. Constitutional: She is oriented to person, place, and time. She appears well-developed and well-nourished. No distress.  Obese Sitting in wheel chair  HENT:  Head: Normocephalic and atraumatic.  Right Ear: External ear normal.  Left Ear: External ear normal.  Mouth/Throat: Oropharynx is clear and moist. No oropharyngeal exudate.  Eyes: Conjunctivae and EOM are normal. Pupils are equal, round, and reactive to light. Right eye exhibits no  discharge. Left eye exhibits no discharge. No scleral icterus.  Neck: Normal range of motion. Neck supple. No JVD present. No tracheal deviation present. No thyromegaly present.  Cardiovascular: Normal rate, regular rhythm, normal heart sounds and intact distal pulses.  Exam reveals no gallop and no friction rub.   No murmur heard. Pulmonary/Chest: Effort normal and breath sounds normal. No respiratory distress. She has no wheezes. She has no rales. She exhibits no tenderness.  barreel chest Purse lip breathing  Abdominal: Soft. Bowel sounds are normal. She exhibits no distension and no mass. There is no tenderness. There is no rebound and no guarding.  Musculoskeletal: Normal range of motion. She exhibits no edema and no tenderness.  Lymphadenopathy:    She has no cervical adenopathy.  Neurological: She is alert and oriented to person, place, and time. She has normal reflexes. No cranial nerve deficit. She exhibits normal muscle tone. Coordination normal.  Skin: Skin is warm and dry. No rash noted. She is not diaphoretic. No erythema. No pallor.  Looks paler than usual  Psychiatric: She has a normal mood and affect. Her behavior is normal. Judgment and thought content normal.          Assessment & Plan:

## 2014-02-28 ENCOUNTER — Ambulatory Visit
Admission: RE | Admit: 2014-02-28 | Discharge: 2014-02-28 | Disposition: A | Discharge status: Home / Self Care | Payer: Medicare Other | Source: Ambulatory Visit | Attending: Gastroenterology | Admitting: Gastroenterology

## 2014-02-28 ENCOUNTER — Other Ambulatory Visit: Payer: Self-pay | Admitting: Gastroenterology

## 2014-02-28 DIAGNOSIS — R1084 Generalized abdominal pain: Secondary | ICD-10-CM

## 2014-03-01 ENCOUNTER — Encounter (HOSPITAL_COMMUNITY): Payer: Medicare Other

## 2014-03-03 ENCOUNTER — Encounter: Payer: Self-pay | Admitting: Internal Medicine

## 2014-03-03 NOTE — Assessment & Plan Note (Signed)
Copd itself is stable You are more short of breath from severe anemia Plesae have Dr Buccinni office call me if they are considering procedures; you need it under anesthesia supervision  REturn 6 weeks or sooner if needed 

## 2014-03-04 ENCOUNTER — Other Ambulatory Visit (HOSPITAL_COMMUNITY): Payer: Self-pay | Admitting: Gastroenterology

## 2014-03-04 ENCOUNTER — Inpatient Hospital Stay (HOSPITAL_COMMUNITY): Admission: RE | Admit: 2014-03-04 | Payer: Medicare Other | Source: Ambulatory Visit

## 2014-03-05 ENCOUNTER — Ambulatory Visit (HOSPITAL_COMMUNITY): Payer: Medicare Other | Attending: Gastroenterology

## 2014-03-05 DIAGNOSIS — D649 Anemia, unspecified: Secondary | ICD-10-CM | POA: Insufficient documentation

## 2014-03-05 LAB — ABO/RH: ABO/RH(D): O POS

## 2014-03-06 ENCOUNTER — Ambulatory Visit (HOSPITAL_COMMUNITY)
Admission: RE | Admit: 2014-03-06 | Discharge: 2014-03-06 | Disposition: A | Discharge status: Home / Self Care | Payer: Medicare Other | Source: Ambulatory Visit | Attending: Gastroenterology | Admitting: Gastroenterology

## 2014-03-06 ENCOUNTER — Encounter (HOSPITAL_COMMUNITY): Payer: Self-pay

## 2014-03-06 DIAGNOSIS — R5381 Other malaise: Secondary | ICD-10-CM | POA: Insufficient documentation

## 2014-03-06 DIAGNOSIS — D649 Anemia, unspecified: Secondary | ICD-10-CM | POA: Diagnosis not present

## 2014-03-06 DIAGNOSIS — J449 Chronic obstructive pulmonary disease, unspecified: Secondary | ICD-10-CM | POA: Insufficient documentation

## 2014-03-06 DIAGNOSIS — R195 Other fecal abnormalities: Secondary | ICD-10-CM | POA: Insufficient documentation

## 2014-03-06 DIAGNOSIS — J4489 Other specified chronic obstructive pulmonary disease: Secondary | ICD-10-CM | POA: Insufficient documentation

## 2014-03-06 DIAGNOSIS — R262 Difficulty in walking, not elsewhere classified: Secondary | ICD-10-CM | POA: Insufficient documentation

## 2014-03-06 DIAGNOSIS — R5383 Other fatigue: Secondary | ICD-10-CM

## 2014-03-06 DIAGNOSIS — D500 Iron deficiency anemia secondary to blood loss (chronic): Secondary | ICD-10-CM | POA: Insufficient documentation

## 2014-03-06 LAB — PREPARE RBC (CROSSMATCH)

## 2014-03-06 MED ORDER — SODIUM CHLORIDE 0.9 % IV SOLN
Freq: Once | INTRAVENOUS | Status: AC
  Administered 2014-03-06: 09:00:00 via INTRAVENOUS

## 2014-03-06 NOTE — Discharge Instructions (Signed)
Blood Transfusion Information WHAT IS A BLOOD TRANSFUSION? A transfusion is the replacement of blood or some of its parts. Blood is made up of multiple cells which provide different functions.  Red blood cells carry oxygen and are used for blood loss replacement.  White blood cells fight against infection.  Platelets control bleeding.  Plasma helps clot blood.  Other blood products are available for specialized needs, such as hemophilia or other clotting disorders. BEFORE THE TRANSFUSION  Who gives blood for transfusions?   You may be able to donate blood to be used at a later date on yourself (autologous donation).  Relatives can be asked to donate blood. This is generally not any safer than if you have received blood from a stranger. The same precautions are taken to ensure safety when a relative's blood is donated.  Healthy volunteers who are fully evaluated to make sure their blood is safe. This is blood bank blood. Transfusion therapy is the safest it has ever been in the practice of medicine. Before blood is taken from a donor, a complete history is taken to make sure that person has no history of diseases nor engages in risky social behavior (examples are intravenous drug use or sexual activity with multiple partners). The donor's travel history is screened to minimize risk of transmitting infections, such as malaria. The donated blood is tested for signs of infectious diseases, such as HIV and hepatitis. The blood is then tested to be sure it is compatible with you in order to minimize the chance of a transfusion reaction. If you or a relative donates blood, this is often done in anticipation of surgery and is not appropriate for emergency situations. It takes many days to process the donated blood. RISKS AND COMPLICATIONS Although transfusion therapy is very safe and saves many lives, the main dangers of transfusion include:   Getting an infectious disease.  Developing a  transfusion reaction. This is an allergic reaction to something in the blood you were given. Every precaution is taken to prevent this. The decision to have a blood transfusion has been considered carefully by your caregiver before blood is given. Blood is not given unless the benefits outweigh the risks. AFTER THE TRANSFUSION  Right after receiving a blood transfusion, you will usually feel much better and more energetic. This is especially true if your red blood cells have gotten low (anemic). The transfusion raises the level of the red blood cells which carry oxygen, and this usually causes an energy increase.  The nurse administering the transfusion will monitor you carefully for complications. HOME CARE INSTRUCTIONS  No special instructions are needed after a transfusion. You may find your energy is better. Speak with your caregiver about any limitations on activity for underlying diseases you may have. SEEK MEDICAL CARE IF:   Your condition is not improving after your transfusion.  You develop redness or irritation at the intravenous (IV) site. SEEK IMMEDIATE MEDICAL CARE IF:  Any of the following symptoms occur over the next 12 hours:  Shaking chills.  You have a temperature by mouth above 102 F (38.9 C), not controlled by medicine.  Chest, back, or muscle pain.  People around you feel you are not acting correctly or are confused.  Shortness of breath or difficulty breathing.  Dizziness and fainting.  You get a rash or develop hives.  You have a decrease in urine output.  Your urine turns a dark color or changes to pink, red, or brown. Any of the following   symptoms occur over the next 10 days:  You have a temperature by mouth above 102 F (38.9 C), not controlled by medicine.  Shortness of breath.  Weakness after normal activity.  The white part of the eye turns yellow (jaundice).  You have a decrease in the amount of urine or are urinating less often.  Your  urine turns a dark color or changes to pink, red, or brown. Document Released: 11/26/2000 Document Revised: 02/21/2012 Document Reviewed: 07/15/2008 [employer] Patient Information 2014 [employer].  

## 2014-03-09 LAB — TYPE AND SCREEN
ABO/RH(D): O POS
ANTIBODY SCREEN: NEGATIVE
UNIT DIVISION: 0
Unit division: 0

## 2014-04-08 ENCOUNTER — Telehealth: Payer: Self-pay | Admitting: Internal Medicine

## 2014-04-08 ENCOUNTER — Ambulatory Visit (INDEPENDENT_AMBULATORY_CARE_PROVIDER_SITE_OTHER): Payer: Medicare Other | Admitting: Internal Medicine

## 2014-04-08 ENCOUNTER — Encounter: Payer: Self-pay | Admitting: Internal Medicine

## 2014-04-08 VITALS — BP 160/90 | HR 97 | Ht 61.0 in | Wt 167.0 lb

## 2014-04-08 DIAGNOSIS — J9610 Chronic respiratory failure, unspecified whether with hypoxia or hypercapnia: Secondary | ICD-10-CM

## 2014-04-08 DIAGNOSIS — R109 Unspecified abdominal pain: Secondary | ICD-10-CM

## 2014-04-08 DIAGNOSIS — D61818 Other pancytopenia: Secondary | ICD-10-CM

## 2014-04-08 NOTE — Progress Notes (Signed)
Subjective:    Patient ID: Bridget Fry, female    DOB: 22-Sep-1931, [age] yT[address]ie Dredge.o.   MRN: [id number]  HPI     #Advanced COPD , DNAR, DNAI - Fev1 10/15/11:  0.91L/52%    - nasal cannula o2, pred 1mg  daily, budesonide nebs, brovana neb, spiriva, and alb prn  - class 3-4 disabling dyspnea  -- tremors and tachycardia with albuterol; changed to xopenex nov 2012 - poor social support: lives alone, confused about medications - echo May 2011: EF 65%, gr 1 diast dysfun and mildly elevated PASP 39mmHg and c/w cor pulmonale - in home hospice since august 2012 - N. acetylcysteine started February 2014  # Recurrrent AECOPD - did not tolerate darilesp early 2012 - Nov 2012 OPD Rx - Feb 2013 - - feb 2013 - May 2013 - May 2014: Z-Pak called in telephone  #CLass 4 dyspnea  - morphine causes delirium  #Chronic low platelets  - 80s and 90s - 2012 onwards   OV  02/25/2014 Chief Complaint  Patient presents with  . COPD    follow-up. Pt states she is having increased SOB. She is having to use her rescue inhaler more often.    For COPD with chronic respiratory failure, DNA R., DO NOT INTUBATE in home hospice for COPD. Since her last visit in December 2014 she's had progressive dyspnea on exertion along with dizziness and fatigue. 02/20/2014 she saw primary care physician and has worsening anemia or new onset anemia. Hemoglobin is some 7.8 g percent with a mean corpuscular volume of 66.2 suggesting microcytic anemia. She is on iron pills since this but she has been having black stools even before starting iron pills . She has a followup in the next few days with  gastroenterologist. There no overt symptoms of COPD exacerbation. COPD cat score is 40 and an all-time high in terms of COPD symptoms. She's afraid about having an endoscopy or colonoscopy and is afraid of anesthesia risk given her advanced COPD and non-resuscitative CODE STATUS  COPD she continues on nebulizer and chronic prednisone  REC Copd  itself is stable You are more short of breath from severe anemia Plesae have Dr Clent RidgesBuccinni office call me if they are considering procedures; you need it under anesthesia supervision  REturn 6 weeks or sooner if needed  OV 04/08/2014  Chief Complaint  Patient presents with  . Follow-up    for COPD. Pt states she still has SOB. She also c/o leg swelling and incresae in weight at times. Pt also states she is concerned about unknown cause of low HGB.    COPD with chronic respiratory failure: stable  Main issue: is fallign counts. Got 1 unit PRBC end march 2015 most recent complete blood count on 04/01/2014 shows a white count of 3.5, hemoglobin of 11.5 and platelet count 36,000. The platelet count continues to drop regularly. She last saw hematologist 2 years ago. She has not had her hematologist since then. She continues abdominal tenderness. A CT scan of the abdomen and pelvis was 4 years ago. She does want another CT scan but wanted to talk to the primary care physician. She is agreeable to rereferral to the hematologist. She was evaluated by gastroenterology for endoscopy but apparently due to her COPD and chronic respiratory failure risk they turned her down  GSK, 0 to 5 scale CAT SCORE 04/24/12 09/01/2012  10/23/2012  01/17/2013  02/25/2014   Never Cough -> Cough all the time 2 5 1 3 2   No  phlegm in chest -> Chest is full of phlegm 4 3 2 4 3   No chest tightness -> Chest feels very tight 4 4 3 4 5   No dyspnea for 1 flight stairs/hill -> Very dyspneic for 1 flight of stairs 5 5 5 5 5   No limitations for ADL at home -> Very limited with ADL at home 5 5 5 5 5   Confident leaving home -> Not at all confident leaving home 4 5 5 5 5   Sleep soundly -> Do not sleep soundly because of lung condition 4 5 5 5 5   Lots of Energy -> No energy at all 5 5 5 5 5   TOTAL Score (max 40)  33 42 31 31 40    Review of Systems  Constitutional: Negative for fever and unexpected weight change.  HENT: Negative for  congestion, dental problem, ear pain, nosebleeds, postnasal drip, rhinorrhea, sinus pressure, sneezing, sore throat and trouble swallowing.   Eyes: Negative for redness and itching.  Respiratory: Positive for shortness of breath. Negative for cough, chest tightness and wheezing.   Cardiovascular: Positive for leg swelling. Negative for palpitations.  Gastrointestinal: Negative for nausea and vomiting.  Genitourinary: Negative for dysuria.  Musculoskeletal: Negative for joint swelling.  Skin: Negative for rash.  Neurological: Negative for headaches.  Hematological: Does not bruise/bleed easily.  Psychiatric/Behavioral: Negative for dysphoric mood. The patient is not nervous/anxious.        Objective:   Physical Exam  Filed Vitals:   04/08/14 1350  BP: 160/90  Pulse: 97  Height: 5\' 1"  (1.549 m)  Weight: 167 lb (75.751 kg)  SpO2: 93%     Constitutional: She is oriented to person, place, and time. She appears well-developed and well-nourished. No distress.  Obese Sitting in wheel chair  HENT:  Head: Normocephalic and atraumatic.  Right Ear: External ear normal.  Left Ear: External ear normal.  Mouth/Throat: Oropharynx is clear and moist. No oropharyngeal exudate.  Eyes: Conjunctivae and EOM are normal. Pupils are equal, round, and reactive to light. Right eye exhibits no discharge. Left eye exhibits no discharge. No scleral icterus.  Neck: Normal range of motion. Neck supple. No JVD present. No tracheal deviation present. No thyromegaly present.  Cardiovascular: Normal rate, regular rhythm, normal heart sounds and intact distal pulses.  Exam reveals no gallop and no friction rub.   No murmur heard. Pulmonary/Chest: Effort normal and breath sounds normal. No respiratory distress. She has no wheezes. She has no rales. She exhibits no tenderness.  barreel chest Purse lip breathing  Abdominal: Soft. Bowel sounds are normal. She exhibits no distension and no mass. There is no  tenderness. There is no rebound and no guarding.  Musculoskeletal: Normal range of motion. She exhibits no edema and no tenderness.  Lymphadenopathy:    She has no cervical adenopathy.  Neurological: She is alert and oriented to person, place, and time. She has normal reflexes. No cranial nerve deficit. She exhibits normal muscle tone. Coordination normal.  Skin: Skin is warm and dry. No rash noted. She is not diaphoretic. No erythema. No pallor.  Looks paler than usual  Psychiatric: She has a normal mood and affect. Her behavior is normal. Judgment and thought content normal.          Assessment & Plan:

## 2014-04-08 NOTE — Telephone Encounter (Signed)
CALLED PATIENT TO SCHEDULE NP APPT NOT ANSWER WILL CALL BACK

## 2014-04-08 NOTE — Patient Instructions (Signed)
#  COPD and chronic respiratory failure - continue oxygen and nebulizers  #ABdominal pain  - will discuss with Dr Eula ListenHussain about getting you CT abdomen  #PAncytopenia  - refer hematologist at cancer center; consider bone biopsy  #FOllowup  3 months or sooner if needed CAT score at fu

## 2014-04-10 ENCOUNTER — Telehealth: Payer: Self-pay | Admitting: Internal Medicine

## 2014-04-10 DIAGNOSIS — D61818 Other pancytopenia: Secondary | ICD-10-CM | POA: Insufficient documentation

## 2014-04-10 NOTE — Telephone Encounter (Signed)
Reminder to call Dr Georgann HousekeeperHUSAIN,KARRAR, MD her pcp about her pancytopenia and abd pain  Dr. Kalman ShanMurali Dakarri Kessinger, M.D., [facility] Pulmonary and Critical Care Medicine Staff Physician  System [place] [facility] Pager: [phone], If no answer or between  15:00h - 7:00h: call [phone]  04/10/2014 8:26 PM

## 2014-04-10 NOTE — Assessment & Plan Note (Signed)
ABdominal pain  - will discuss with Dr Eula ListenHussain about getting you CT abdomen

## 2014-04-10 NOTE — Assessment & Plan Note (Signed)
#  COPD and chronic respiratory failure - continue oxygen and nebulizers #FOllowup  3 months or sooner if needed CAT score at [facility]

## 2014-04-10 NOTE — Assessment & Plan Note (Signed)
Last seen by Dr Dalene Carrowdogwu few years ago. No fu since then. Has all 3 cell count lines down. GI workup Re-refer heme esp because they could not proceed wit gi workup for anemia; ? She needs bone marrow bx. If so, can it be done without sedation

## 2014-04-10 NOTE — Telephone Encounter (Signed)
C/D 04/10/14 for appt. 04/17/14 °

## 2014-04-10 NOTE — Telephone Encounter (Signed)
S/W PATIENT AND GVE APPT FOR 05/06 @ 2:30 W/DR. CHISM. PATIENT IS A FORMER PATIENT OF DR. Dalene CarrowDOGWU LAST SEEN 2013.

## 2014-04-17 ENCOUNTER — Ambulatory Visit (HOSPITAL_BASED_OUTPATIENT_CLINIC_OR_DEPARTMENT_OTHER): Admitting: Internal Medicine

## 2014-04-17 ENCOUNTER — Encounter: Payer: Self-pay | Admitting: Internal Medicine

## 2014-04-17 ENCOUNTER — Telehealth: Payer: Self-pay | Admitting: Internal Medicine

## 2014-04-17 VITALS — BP 153/72 | HR 82 | Temp 97.5°F | Resp 18 | Ht 61.0 in | Wt 167.7 lb

## 2014-04-17 DIAGNOSIS — D61818 Other pancytopenia: Secondary | ICD-10-CM

## 2014-04-17 DIAGNOSIS — D649 Anemia, unspecified: Secondary | ICD-10-CM

## 2014-04-17 DIAGNOSIS — J449 Chronic obstructive pulmonary disease, unspecified: Secondary | ICD-10-CM

## 2014-04-17 DIAGNOSIS — E538 Deficiency of other specified B group vitamins: Secondary | ICD-10-CM

## 2014-04-17 DIAGNOSIS — D72819 Decreased white blood cell count, unspecified: Secondary | ICD-10-CM

## 2014-04-17 DIAGNOSIS — D696 Thrombocytopenia, unspecified: Secondary | ICD-10-CM

## 2014-04-17 NOTE — Progress Notes (Signed)
[place], [place] [employer], [address] [place] [address]  DIAGNOSIS: Pancytopenia - Plan: CBC with Differential, CBC with Differential, Comprehensive metabolic panel (Cmet) - [facility], Lactate dehydrogenase (LDH) - [facility]  THROMBOCYTOPENIA, CHRONIC  COPD (chronic obstructive pulmonary disease)  B12 DEFICIENCY  Chief Complaint  Patient presents with  . B12 DEFICIENCY    CURRENT TREATMENT:  Vitamin B12 injections monthly  INTERVAL HISTORY: Bridget Fry 80 y.o. female with a history of chronic thrombocytopenia and B12 deficiency who presents for followup.  Patient is wheelchair bound with COPD requiring oxygen.  She had received unit of blood on 03/05/2014.  She reports decreased energy and increased sleeping all of the time.  He stated that the last labs by Dr. Lysle Rubens showed decreased plts.  Labs on 04/01/2014 done at [facility]ated a WBC of 3.5, hemoglobin 11.5, hematocrit 35.9, MCV 77.3 and plts of 36.  She reports a nose bled every other day and hemorrhoids which are constantly bleeding.  She was started ferrous sulfate 325 mg    MEDICAL HISTORY: Past Medical History  Diagnosis Date  . Other B-complex deficiencies   . Thrombocytopenia, unspecified   . Esophageal reflux   . Unspecified essential hypertension   . Depressive disorder, not elsewhere classified   . Anxiety state, unspecified   . Chronic airway obstruction, not elsewhere classified     INTERIM HISTORY: has B12 DEFICIENCY; THROMBOCYTOPENIA, CHRONIC; ANXIETY; DEPRESSION; HYPERTENSION; OTHER DISEASES OF NASAL CAVITY AND SINUSES; C O P D WITH ACUTE EXACERBATION; COPD; GERD; Chronic respiratory failure; Chest pain, atypical; Epistaxis; COPD (chronic obstructive pulmonary disease); Fatigue; Abdominal  pain, other specified site; Falls; and Pancytopenia on her problem list.    ALLERGIES:  is allergic to albuterol sulfate; codeine; morphine and related;  tetracycline; and xopenex.  MEDICATIONS: has a current medication list which includes the following prescription(s): acai, aloe vera, arformoterol, benzonatate, biotin, budesonide, calcium citrate, cyanocobalamin, dextromethorphan-guaifenesin, diazepam, ferrous sulfate, fluoxetine, folic acid, furosemide, glipizide, hydrocodone-homatropine, levalbuterol, meclizine, NON FORMULARY, pantoprazole, polyethylene glycol powder, potassium chloride, prednisone, promethazine, senokot s, sitagliptin, and tiotropium.  SURGICAL HISTORY:  Past Surgical History  Procedure Laterality Date  . Total abdominal hysterectomy    . Abdominoplasty    . Cataract extraction  04/2010  . Breast biopsy      REVIEW OF SYSTEMS:   Constitutional: Denies fevers, chills or abnormal weight loss Eyes: Denies blurriness of vision Ears, nose, mouth, throat, and face: Denies mucositis or sore throat Respiratory: Denies cough, dyspnea or wheezes Cardiovascular: Denies palpitation, chest discomfort or lower extremity swelling Gastrointestinal:  Denies nausea, heartburn or change in bowel habits Skin: Denies abnormal skin rashes Lymphatics: Denies new lymphadenopathy or easy bruising Neurological:Denies numbness, tingling or new weaknesses Behavioral/Psych: Mood is stable, no new changes  All other systems were reviewed with the patient and are negative.  PHYSICAL EXAMINATION: ECOG PERFORMANCE STATUS: 1 - Symptomatic but completely ambulatory  Blood pressure 153/72, pulse 82, temperature 97.5 F (36.4 C), temperature source Oral, resp. rate 18, height 5' 1"  (1.549 m), weight 167 lb 11.2 oz (76.068 kg), SpO2 91.00%.  GENERAL:alert, no distress and comfortable; Sitting in wheelchair on oxygen by nasal canula; chronically ill appearing.  SKIN: skin color, texture, turgor are normal, no rashes or significant lesions EYES: normal, Conjunctiva are pink and non-injected, sclera clear OROPHARYNX:no exudate, no erythema and lips,  buccal mucosa, and tongue normal  NECK: supple, thyroid normal size, non-tender, without nodularity LYMPH:  no palpable lymphadenopathy in  the cervical, axillary or supraclavicular LUNGS: clear to auscultation with normal breathing effort, no wheezes or rhonchi HEART: regular rate & rhythm and no murmurs and no lower extremity edema ABDOMEN:abdomen soft, non-tender and normal bowel sounds Musculoskeletal:no cyanosis of digits and no clubbing  NEURO: alert & oriented x 3 with fluent speech, no focal motor/sensory deficits  Labs:  Lab Results  Component Value Date   WBC 4.4* 08/24/2013   HGB 11.7* 08/24/2013   HCT 35.4* 08/24/2013   MCV 79.7 08/24/2013   PLT 58.0 Repeated and verified X2.* 08/24/2013   NEUTROABS 3.3 08/24/2013      Chemistry      Component Value Date/Time   NA 136 08/24/2013 1421   K 4.1 08/24/2013 1421   CL 97 08/24/2013 1421   CO2 31 08/24/2013 1421   BUN 14 08/24/2013 1421   CREATININE 0.9 08/24/2013 1421      Component Value Date/Time   CALCIUM 9.7 08/24/2013 1421   ALKPHOS 62 05/08/2010 0630   AST 40* 05/08/2010 0630   ALT 78* 05/08/2010 0630   BILITOT 0.9 05/08/2010 0630       Studies:  No results found.   RADIOGRAPHIC STUDIES: No results found.  ASSESSMENT: Bridget Fry 80 y.o. female with a history of Pancytopenia - Plan: CBC with Differential, CBC with Differential, Comprehensive metabolic panel (Cmet) - [facility], Lactate dehydrogenase (LDH) - [facility]  THROMBOCYTOPENIA, CHRONIC  COPD (chronic obstructive pulmonary disease)  B12 DEFICIENCY   PLAN:   1. Chronic Thrombocytopenia. --We reviewed her labs extensively.  Her plts were nearly 120K seven years ago and has slowly drifted downwardly to 58 as reported above.  We discussed our concerns of a bone marrow problem given additional decreases in her hemoglobin (although this can in part be explained due to iron deficiency) and leukopenia.  We suggested that she will likely need a bone marrow biopsy to further  determine the etiology and rule out myelofibrosis versus MDS versus other primary bone marrow problems.  She denies any prolonged bleeding or excessive bruising.   She prefers to establish trend for her plts and will consider a bone marrow if warranted on the next one month follow up visit. She was provided handouts on thrombocytopenia.   2. Anemia, mixed etiology. --She certainly appear to have to iron defiency.  We will check iron studies as well. She does reports a longstanding history of hemorrhoids. Continue vitamin B-12 monthly injections.  3. Leukocytopenia NOS --We reviewed her WBC trend.  She has had mild leukocytopenia for the past few years as well.  Her ANC is 3.3.  She is not at additional risk of infections presently.   4.COPD on oxygen. --She continues to follow up with her pulmonary team.   All questions were answered. The patient knows to call the clinic with any problems, questions or concerns. We can certainly see the patient much sooner if necessary.  I spent 15 minutes counseling the patient face to face. The total time spent in the appointment was 25 minutes.    Concha Norway, MD 04/18/2014 9:30 AM

## 2014-04-17 NOTE — Patient Instructions (Signed)
Iron tablets, capsules, extended-release tablets What is this medicine? IRON (AHY ern) replaces iron that is essential to healthy red blood cells. Iron is used to treat iron deficiency anemia. Anemia may cause problems like tiredness, shortness of breath, or slowed growth in children. Only take iron if your doctor has told you to. Do not treat yourself with iron if you are feeling tired. Most healthy people get enough iron in their diets, particularly if they eat cereals, meat, poultry, and fish. This medicine may be used for other purposes; ask your health care provider or pharmacist if you have questions. COMMON BRAND NAME(S): Bifera, Duofer, Feosol Complete, Feosol Natural Release, Feosol, Feratab, Ferate , Fergon, Fero-Grad 500, Ferretts, Ferrimin , Ferro-Sequels, Hemocyte, Nephro-Fer, NovaFerrum, ProFe, Slow Fe, Slow Iron , Tandem What should I tell my health care provider before I take this medicine? They need to know if you have any of these conditions: -frequently drink alcohol -bowel disease -hemolytic anemia -iron overload (hemochromatosis, hemosiderosis) -liver disease -problems with swallowing -stomach ulcer or other stomach problems -an unusual or allergic reaction to iron, other medicines, foods, dyes, or preservatives -pregnant or trying to get pregnant -breast-feeding How should I use this medicine? Take this medicine by mouth with a glass of water or fruit juice. Follow the directions on the prescription label. Swallow whole. Do not crush or chew. Take this medicine in an upright or sitting position. Try to take any bedtime doses at least 10 minutes before lying down. You may take this medicine with food. Take your medicine at regular intervals. Do not take your medicine more often than directed. Do not stop taking except on your doctor's advice. Talk to your pediatrician regarding the use of this medicine in children. While this drug may be prescribed for selected conditions,  precautions do apply. Overdosage: If you think you have taken too much of this medicine contact a poison control center or emergency room at once. NOTE: This medicine is only for you. Do not share this medicine with others. What if I miss a dose? If you miss a dose, take it as soon as you can. If it is almost time for your next dose, take only that dose. Do not take double or extra doses. What may interact with this medicine? If you are taking this iron product, you should not take iron in any other medicine or dietary supplement. This medicine may also interact with the following medications: -alendronate -antacids -cefdinir -chloramphenicol -cholestyramine -deferoxamine -dimercaprol -etidronate -medicines for stomach ulcers or other stomach problems -pancreatic enzymes -quinolone antibiotics (examples: Cipro, Floxin, Levaquin, Tequin and others) -risedronate -tetracycline antibiotics (examples: doxycycline, tetracycline, minocycline, and others) -thyroid hormones This list may not describe all possible interactions. Give your health care provider a list of all the medicines, herbs, non-prescription drugs, or dietary supplements you use. Also tell them if you smoke, drink alcohol, or use illegal drugs. Some items may interact with your medicine. What should I watch for while using this medicine? Use iron supplements only as directed by your health care professional. You will need important blood work while you are taking this medicine. It may take 3 to 6 months of therapy to treat low iron levels. Pregnant women should follow the dose and length of iron treatment as directed by their doctors. Do not use iron longer than prescribed, and do not take a higher dose than recommended. Long-term use may cause excess iron to build-up in the body. Do not take iron with antacids. If you need   to take an antacid, take it 2 hours after a dose of iron. What side effects may I notice from receiving this  medicine? Side effects that you should report to your doctor or health care professional as soon as possible: -allergic reactions like skin rash, itching or hives, swelling of the face, lips, or tongue -blue lips, nails, or palms -dark colored stools (this may be due to the iron, but can indicate a more serious condition) -drowsiness -pain with or difficulty swallowing -pale or clammy skin -seizures -stomach pain -unusually weak or tired -vomiting -weak, fast, or irregular heartbeat Side effects that usually do not require medical attention (report to your doctor or health care professional if they continue or are bothersome): -constipation -indigestion -nausea or stomach upset This list may not describe all possible side effects. Call your doctor for medical advice about side effects. You may report side effects to FDA at 1-800-FDA-1088. Where should I keep my medicine? Keep out of the reach of children. Even small amounts of iron can be harmful to a child. Store at room temperature between 15 and 30 degrees C (59 and 86 degrees F). Keep container tightly closed. Throw away any unused medicine after the expiration date. NOTE: This sheet is a summary. It may not cover all possible information. If you have questions about this medicine, talk to your doctor, pharmacist, or health care provider.  2014, Elsevier/Gold Standard. (2008-04-16 17:03:41) Ferritin This is done to test for anemia. Anemia occurs when the amount of hemoglobin (found in the red blood cells) drops below normal. Hemoglobin is necessary for the transportation of oxygen throughout the body. Blood tests may show a variety of common, treatable abnormalities that can lead to problems associated with anemia. Iron deficiency anemia is the most common of the anemias. It is usually due to bleeding. In women, iron deficiency may be due to heavy menstrual periods. In older women and in men, the bleeding is usually from disease of the  intestines. In children and in pregnant women, the body needs more iron. Iron deficiency may be due to simply not eating enough iron in the diet. Iron deficiency may also result from some extreme diets. Treatment of iron deficiency usually involves iron supplements. In older women and in men, there is usually some further testing needed to determine why the person is iron deficient.  PREPARATION FOR TEST A blood sample is obtained by inserting a needle into a vein in the arm. NORMAL FINDINGS Female: 12-300ng/ml or 12-300  g/L (SI units) Female:  10-150 ng/ml or 10-150  g/L (SI units) Children/adolescent:  Newborn: 25-200 ng/ml  1 month: 200-600 ng/ml  2-5 months: 50-200 ng/ml  6 months-15 years: 7-142 ng/ml Ranges for normal findings may vary among different laboratories and hospitals. You should always check with your doctor after having lab work or other tests done to discuss the meaning of your test results and whether your values are considered within normal limits. MEANING OF TEST  Your caregiver will go over the test results with you and discuss the importance and meaning of your results, as well as treatment options and the need for additional tests if necessary. OBTAINING THE TEST RESULTS  It is your responsibility to obtain your test results. Ask the lab or department performing the test when and how you will get your results. Document Released: 12/22/2004 Document Revised: 02/21/2012 Document Reviewed: 11/08/2008 [facility] Patient Information 2014 [place], [place]. Iron Deficiency Anemia, Adult Anemia is a condition in which there are less  red blood cells or hemoglobin in the blood than normal. Hemoglobin is this part of red blood cells that carries oxygen. Iron deficiency anemia is anemia caused by too little iron. It is the most common type of anemia. It may leave you tired and short of breath. CAUSES   Lack of iron in the diet.  Poor absorption of iron, as seen with intestinal  disorders.  Intestinal bleeding.  Heavy periods. SIGNS AND SYMPTOMS  Mild anemia may not be noticeable. Symptoms may include:  Fatigue.  Headache.  Pale skin.  Weakness.  Tiredness.  Shortness of breath.  Dizziness.  Cold hands and feet.  Fast or irregular heartbeat. DIAGNOSIS  Diagnosis requires a thorough evaluation and physical exam by your health care provider. Blood tests are generally used to confirm iron deficiency anemia. Additional tests may be done to find the underlying cause of your anemia. These may include:  Testing for blood in the stool (fecal occult blood test).  A procedure to see inside the colon and rectum (colonoscopy).  A procedure to see inside the esophagus and stomach (endoscopy). TREATMENT  Iron deficiency anemia is treated by correcting the cause of the deficiency. Treatment may involve:  Adding iron-rich foods to your diet.  Taking iron supplements. Pregnant or breastfeeding women need to take extra iron, because their normal diet usually does not provide the required amount.  Taking vitamins. Vitamin C improves the absorption of iron. Your health care provider may recommend taking your iron tablets with a glass of orange juice or vitamin C supplement.  Medicines to make heavy menstrual flow lighter.  Surgery. HOME CARE INSTRUCTIONS   Take iron as directed by your health care provider.  If you cannot tolerate taking iron supplements by mouth, talk to your health care provider about taking them through a vein (intravenously) or an injection into a muscle.  For the best iron absorption, iron supplements should be taken on an empty stomach. If you cannot tolerate them on an empty stomach, you may need to take them with food.  Do not drink milk or take antacids at the same time as your iron supplements. Milk and antacids may interfere with the absorption of iron.  Iron supplements can cause constipation. Make sure to include fiber in your  diet to prevent constipation. A stool softener may also be recommended.  Take vitamins as directed by your health care provider.  Eat a diet rich in iron. Foods high in iron include liver, lean beef, whole-grain bread, eggs, dried fruit, and dark green, leafy vegetables. SEEK IMMEDIATE MEDICAL CARE IF:   You faint. If this happens, do not drive. Call your local emergency services (911 in [place] if no other help is available.  You have chest pain.  You feel nauseous or vomit.  You have severe or increased shortness of breath with activity.  You feel weak.  You have a rapid heartbeat.  You have unexplained sweating.  You become lightheaded when getting up from a chair or bed. MAKE SURE YOU:   Understand these instructions.  Will watch your condition.  Will get help right away if you are not doing well or get worse. Document Released: 11/26/2000 Document Revised: 09/19/2013 Document Reviewed: 08/06/2013 [facility] Patient Information 2014 [place], [place]LL[place].

## 2014-04-17 NOTE — Telephone Encounter (Signed)
Gave pt appt for labs and MD for May  and June 2015

## 2014-04-18 ENCOUNTER — Telehealth: Payer: Self-pay | Admitting: Internal Medicine

## 2014-04-18 MED ORDER — AZITHROMYCIN 250 MG PO TABS: ORAL_TABLET | ORAL | Status: DC

## 2014-04-18 MED ORDER — PREDNISONE 10 MG PO TABS: ORAL_TABLET | ORAL | Status: DC

## 2014-04-18 NOTE — Telephone Encounter (Signed)
Ok for  zpak x 2.SHe always wants it that way  Pred  - Take prednisone 40 mg daily x 2 days, then 20mg  daily x 2 days, then 10mg  daily x 2 days, then 5mg  daily x 2 days and stop or continue at baseline whatever the baseline is   Also,    Please have her PCP Georgann HousekeeperHUSAIN,KARRAR, MD give me a call on my cell.     Dr. Kalman ShanMurali Jaydeen Darley, M.D., [facility] Pulmonary and Critical Care Medicine Staff Physician [facility] [place] [facility] Critical [facility] Pager: [phone], If no answer or between  15:00h - 7:00h: call [phone]  04/18/2014 12:04 PM   '

## 2014-04-18 NOTE — Addendum Note (Signed)
Addended by: Tommie SamsSILVA, MINDY S on: 04/18/2014 12:12 PM   Modules accepted: Orders

## 2014-04-18 NOTE — Telephone Encounter (Signed)
Spoke with pt--requesting 2 ZPAK rx and a prednisone taper C/O SOB, chest congestion and head congestion with increased cough, eye watering and PND x 2 days. Denies fever.  [facility] [address]  Allergies  Allergen Reactions  . Albuterol Sulfate     Increased heart rate  . Codeine     REACTION: nausea  . Morphine And Related   . Tetracycline     REACTION: rash  . Xopenex [Levalbuterol]     Nebulizer caused nervousness, inhaler ok   Please advise Dr Marchelle Gearingamaswamy. Thanks.

## 2014-04-18 NOTE — Telephone Encounter (Signed)
Pt aware of recs. RX called in. Called PCP [phone] pt give MR cell #. Left detailed message on nurse VM w/ MR cell # and pt name DOB

## 2014-04-18 NOTE — Telephone Encounter (Signed)
Accidentally closed the other one. Please see the other one  Dr. Kalman ShanMurali Ladiamond Fry, M.D., [facility] Pulmonary and Critical Care Medicine Staff Physician [facility] [place] [facility] and Critical Care Pager: [phone], If no answer or between  15:00h - 7:00h: call [phone]  04/18/2014 12:05 PM

## 2014-04-18 NOTE — Telephone Encounter (Signed)
Bridget ShanMurali Ramaswamy, MD at 04/10/2014 8:26 PM     Status: Signed        Reminder to call Dr Georgann HousekeeperHUSAIN,KARRAR, MD her pcp about her pancytopenia and abd pain  --  This is per another message MR sent to triage.

## 2014-04-19 ENCOUNTER — Telehealth: Payer: Self-pay | Admitting: Internal Medicine

## 2014-04-19 NOTE — Telephone Encounter (Signed)
, °

## 2014-05-01 ENCOUNTER — Other Ambulatory Visit: Payer: Medicare Other

## 2014-05-15 ENCOUNTER — Ambulatory Visit: Payer: Medicare Other

## 2014-05-15 ENCOUNTER — Other Ambulatory Visit: Payer: Medicare Other

## 2014-05-21 ENCOUNTER — Other Ambulatory Visit: Payer: Self-pay | Admitting: Internal Medicine

## 2014-05-28 ENCOUNTER — Telehealth: Payer: Self-pay | Admitting: Internal Medicine

## 2014-05-28 NOTE — Telephone Encounter (Signed)
lmtcb for Bridget Fry.

## 2014-05-29 MED ORDER — AZITHROMYCIN 250 MG PO TABS: ORAL_TABLET | ORAL | Status: DC

## 2014-05-29 MED ORDER — PREDNISONE 10 MG PO TABS: ORAL_TABLET | ORAL | Status: DC

## 2014-05-29 NOTE — Telephone Encounter (Signed)
I have sent in RX to pharm. Pt aware. Nothing further needed

## 2014-05-29 NOTE — Telephone Encounter (Signed)
Send 4 refills of zpak,   Not to myself: I t I will d/w her about taking daily low dose zpak at followup  Dr. Kalman ShanMurali Ramaswamy, M.D., [facility] Pulmonary and Critical Care Medicine Staff Physician [facility] [place] [facility] and Critical Care Pager: [phone], If no answer or between  15:00h - 7:00h: call [phone]  05/29/2014 5:20 PM

## 2014-05-29 NOTE — Telephone Encounter (Signed)
Pt calling again for a refill on a [facility] to [facility] [address] plse call this in.Bridget Fry

## 2014-05-29 NOTE — Telephone Encounter (Signed)
Ok for another round of Z pak  Also,   Take prednisone 40 mg daily x 2 days, then 20mg  daily x 2 days, then 10mg  daily x 2 days, then 5mg  daily x 2 days and stop or go to baseline dose whatever the case might be  Dr. Kalman ShanMurali Ramaswamy, M.D., [facility]P Pulmonary and Critical Care Medicine Staff Physician [facility] [place] [facility] and Critical Care Pager: [phone], If no answer or between  15:00h - 7:00h: call [phone]  05/29/2014 4:50 PM

## 2014-05-29 NOTE — Telephone Encounter (Signed)
lmtcb for Onyeje x 2

## 2014-05-29 NOTE — Telephone Encounter (Signed)
Called spoke with pt. She is requesting another round of ZPAK.  She started 1st round 1 week ago Thursday. She needs another round for her second dose w/ refills. She c/o nasal cong, feverish feeling, sweats, increase SOB, body aches, slight cough. Using tess pearls. Please advise MR thanks  [address] [facility].   Allergies  Allergen Reactions  . Albuterol Sulfate     Increased heart rate  . Codeine     REACTION: nausea  . Morphine And Related   . Tetracycline     REACTION: rash  . Xopenex [Levalbuterol]     Nebulizer caused nervousness, inhaler ok

## 2014-05-29 NOTE — Telephone Encounter (Signed)
Called Hospice and spoke with Bridget Fry - gave verbal order for the zpak and pred taper as directed by MR below. Bridget Fry stated message will be given to their Nurse Manager as Bridget Fry is unavailable.  Called spoke with patient and informed her that MR okayed for another zpak and pred taper.  Pt okay with this and verbalized her understanding.  Pt does request additional refills on the zpak because her next appt is not until 8/26 and "sometimes it's the weekend and things can get kind of sticky" and she would like to have something on hand.  Advised [facility] and we will let her know about the zpak.  MR please advise, thank you.

## 2014-06-28 ENCOUNTER — Other Ambulatory Visit: Payer: Self-pay | Admitting: Internal Medicine

## 2014-07-30 ENCOUNTER — Telehealth: Payer: Self-pay | Admitting: Internal Medicine

## 2014-07-30 MED ORDER — PREDNISONE 10 MG PO TABS: ORAL_TABLET | ORAL | Status: DC

## 2014-07-30 MED ORDER — PREDNISONE 10 MG PO TABS: 5.0000 mg | ORAL_TABLET | Freq: Every day | ORAL | Status: AC

## 2014-07-30 NOTE — Telephone Encounter (Signed)
Pt was c/o intense sore throat that radiates up to her ears. Pt states she was taking her zpak that was previously filled and needed a refill of her daily pred and a new rx for a pred taper. Informed pt of refills sent in. Pt verbalized understanding and denied any further questions or concerns at this time.   Per MR-  Refill pred taper:Take 40 mg daily x 2 days, 20 mg daily x 2 days, 10 mg daily x2days, 5 mg daily x 2 days then continue to baseline dose  Refill baseline dose of 5mg  every other day

## 2014-07-30 NOTE — Telephone Encounter (Signed)
Called and spoke with pt and she stated that she needs the refills of the pred taper and the pred daily dose.  She stated that she is out of both and is to start on the pred taper today.  MR please advise. Thanks  Allergies  Allergen Reactions  . Albuterol Sulfate     Increased heart rate  . Codeine     REACTION: nausea  . Morphine And Related   . Tetracycline     REACTION: rash  . Xopenex [Levalbuterol]     Nebulizer caused nervousness, inhaler ok    Current Outpatient Prescriptions on File Prior to Visit  Medication Sig Dispense Refill  . ACAI PO Take 1 tablet by mouth daily.      . ALOE VERA PO Take 1 tablet by mouth daily.      Marland Kitchen arformoterol (BROVANA) 15 MCG/2ML NEBU Take 2 mLs (15 mcg total) by nebulization 2 (two) times daily.  120 mL  6  . azithromycin (ZITHROMAX) 250 MG tablet Take as directed  6 tablet  4  . benzonatate (TESSALON) 200 MG capsule TAKE ONE CAPSULE BY MOUTH THREE TIMES DAILY AS NEEDED.  90 capsule  0  . BIOTIN PO Take 1 tablet by mouth daily.      . budesonide (PULMICORT) 0.25 MG/2ML nebulizer solution Take 2 mLs (0.25 mg total) by nebulization 2 (two) times daily.  120 mL  6  . Calcium Citrate (CITRACAL PO) Take 1 tablet by mouth daily.      . cyanocobalamin (,VITAMIN B-12,) 1000 MCG/ML injection INJECT ONE ML (CC) SUBCUTANEOUSLY ONCE EVERY MONTH  1 mL  5  . dextromethorphan-guaiFENesin (MUCINEX DM) 30-600 MG per 12 hr tablet Take 1 tablet by mouth every 12 (twelve) hours.        . diazepam (VALIUM) 5 MG tablet Take 5 mg by mouth at bedtime as needed.      . ferrous sulfate 325 (65 FE) MG tablet Take 325 mg by mouth 3 (three) times daily with meals.      Marland Kitchen FLUoxetine (PROZAC) 20 MG tablet Take 20 mg by mouth 2 (two) times daily.        . folic acid (FOLVITE) 1 MG tablet Take 1 mg by mouth daily.      . furosemide (LASIX) 40 MG tablet One tablet daily      . glipiZIDE (GLUCOTROL) 5 MG tablet Take 5 mg by mouth 2 (two) times daily before a meal.       .  HYDROcodone-homatropine (HYCODAN) 5-1.5 MG/5ML syrup Take 5 mLs by mouth every 6 (six) hours as needed.  120 mL  0  . levalbuterol (XOPENEX HFA) 45 MCG/ACT inhaler Inhale 2 puffs into the lungs as needed. HOSPICE PATIENT  1 Inhaler  6  . meclizine (ANTIVERT) 12.5 MG tablet Take 12.5 mg by mouth every 4 (four) hours as needed.      . NON FORMULARY Tumeric Tablets twice a day      . pantoprazole (PROTONIX) 40 MG tablet Take 40 mg by mouth daily.        . polyethylene glycol powder (GLYCOLAX/MIRALAX) powder USE 17 GRAMS IN LIQUID EVERY DAY AS NEEDED TO PREVENT CONSTIPATION.  255 g  3  . potassium chloride (KLOR-CON) 10 MEQ CR tablet Take 10 mEq by mouth daily.        . predniSONE (DELTASONE) 10 MG tablet 5 mg. 5mg  every other day.      . predniSONE (DELTASONE) 10 MG tablet Take 40  mg daily x 2 days, 20 mg daily x 2 days, 10 mg daily x2days, 5 mg daily x 2 days then continue to baseline dose  15 tablet  0  . promethazine (PHENERGAN) 25 MG tablet 25 mg every 8 (eight) hours as needed.       . SENOKOT S 8.6-50 MG per tablet TAKE ONE TO FOUR TABLETS BY MOUTH ONCE OR TWICE DAILY AS DIRECTED. DO NOT EXCEED 8 TABLETS IN 24 HOURS.  120 tablet  0  . sitaGLIPtin (JANUVIA) 50 MG tablet Take 100 mg by mouth daily.       Marland Kitchen. tiotropium (SPIRIVA) 18 MCG inhalation capsule Place 18 mcg into inhaler and inhale daily.         No current facility-administered medications on file prior to visit.

## 2014-08-02 ENCOUNTER — Telehealth: Payer: Self-pay | Admitting: Internal Medicine

## 2014-08-02 DIAGNOSIS — E538 Deficiency of other specified B group vitamins: Secondary | ICD-10-CM

## 2014-08-02 MED ORDER — HYDROCODONE-HOMATROPINE 5-1.5 MG/5ML PO SYRP: 5.0000 mL | ORAL_SOLUTION | Freq: Four times a day (QID) | ORAL | Status: DC | PRN

## 2014-08-02 MED ORDER — BENZONATATE 200 MG PO CAPS: ORAL_CAPSULE | ORAL | Status: DC

## 2014-08-02 NOTE — Telephone Encounter (Addendum)
MR is off today.  Please advise if ok to send in refills for the pt ---she is a hospice pt.  thanks  Allergies  Allergen Reactions  . Albuterol Sulfate     Increased heart rate  . Codeine     REACTION: nausea  . Morphine And Related   . Tetracycline     REACTION: rash  . Xopenex [Levalbuterol]     Nebulizer caused nervousness, inhaler ok    Current Outpatient Prescriptions on File Prior to Visit  Medication Sig Dispense Refill  . ACAI PO Take 1 tablet by mouth daily.      . ALOE VERA PO Take 1 tablet by mouth daily.      Marland Kitchen. arformoterol (BROVANA) 15 MCG/2ML NEBU Take 2 mLs (15 mcg total) by nebulization 2 (two) times daily.  120 mL  6  . azithromycin (ZITHROMAX) 250 MG tablet Take as directed  6 tablet  4  . benzonatate (TESSALON) 200 MG capsule TAKE ONE CAPSULE BY MOUTH THREE TIMES DAILY AS NEEDED.  90 capsule  0  . BIOTIN PO Take 1 tablet by mouth daily.      . budesonide (PULMICORT) 0.25 MG/2ML nebulizer solution Take 2 mLs (0.25 mg total) by nebulization 2 (two) times daily.  120 mL  6  . Calcium Citrate (CITRACAL PO) Take 1 tablet by mouth daily.      . cyanocobalamin (,VITAMIN B-12,) 1000 MCG/ML injection INJECT ONE ML (CC) SUBCUTANEOUSLY ONCE EVERY MONTH  1 mL  5  . dextromethorphan-guaiFENesin (MUCINEX DM) 30-600 MG per 12 hr tablet Take 1 tablet by mouth every 12 (twelve) hours.        . diazepam (VALIUM) 5 MG tablet Take 5 mg by mouth at bedtime as needed.      . ferrous sulfate 325 (65 FE) MG tablet Take 325 mg by mouth 3 (three) times daily with meals.      Marland Kitchen. FLUoxetine (PROZAC) 20 MG tablet Take 20 mg by mouth 2 (two) times daily.        . folic acid (FOLVITE) 1 MG tablet Take 1 mg by mouth daily.      . furosemide (LASIX) 40 MG tablet One tablet daily      . glipiZIDE (GLUCOTROL) 5 MG tablet Take 5 mg by mouth 2 (two) times daily before a meal.       . HYDROcodone-homatropine (HYCODAN) 5-1.5 MG/5ML syrup Take 5 mLs by mouth every 6 (six) hours as needed.  120 mL  0  .  levalbuterol (XOPENEX HFA) 45 MCG/ACT inhaler Inhale 2 puffs into the lungs as needed. HOSPICE PATIENT  1 Inhaler  6  . meclizine (ANTIVERT) 12.5 MG tablet Take 12.5 mg by mouth every 4 (four) hours as needed.      . NON FORMULARY Tumeric Tablets twice a day      . pantoprazole (PROTONIX) 40 MG tablet Take 40 mg by mouth daily.        . polyethylene glycol powder (GLYCOLAX/MIRALAX) powder USE 17 GRAMS IN LIQUID EVERY DAY AS NEEDED TO PREVENT CONSTIPATION.  255 g  3  . potassium chloride (KLOR-CON) 10 MEQ CR tablet Take 10 mEq by mouth daily.        . predniSONE (DELTASONE) 10 MG tablet Take 40 mg daily x 2 days, 20 mg daily x 2 days, 10 mg daily x2days, 5 mg daily x 2 days then continue to baseline dose  15 tablet  0  . predniSONE (DELTASONE) 10 MG tablet Take  0.5 tablets (5 mg total) by mouth daily with breakfast. 5mg  every other day.  30 tablet  2  . promethazine (PHENERGAN) 25 MG tablet 25 mg every 8 (eight) hours as needed.       . SENOKOT S 8.6-50 MG per tablet TAKE ONE TO FOUR TABLETS BY MOUTH ONCE OR TWICE DAILY AS DIRECTED. DO NOT EXCEED 8 TABLETS IN 24 HOURS.  120 tablet  0  . sitaGLIPtin (JANUVIA) 50 MG tablet Take 100 mg by mouth daily.       Marland Kitchen tiotropium (SPIRIVA) 18 MCG inhalation capsule Place 18 mcg into inhaler and inhale daily.         No current facility-administered medications on file prior to visit.

## 2014-08-02 NOTE — Telephone Encounter (Signed)
Ok to send refills, whatever we've been doing previously under MR

## 2014-08-02 NOTE — Telephone Encounter (Signed)
Called onjey and LMTCB x1. rx's sent in.

## 2014-08-05 NOTE — Telephone Encounter (Signed)
lmomtcb for [employer]

## 2014-08-06 MED ORDER — HYDROCODONE-HOMATROPINE 5-1.5 MG/5ML PO SYRP: 5.0000 mL | ORAL_SOLUTION | Freq: Four times a day (QID) | ORAL | Status: DC | PRN

## 2014-08-06 MED ORDER — BENZONATATE 200 MG PO CAPS: ORAL_CAPSULE | ORAL | Status: AC

## 2014-08-06 MED ORDER — HYDROCODONE-HOMATROPINE 5-1.5 MG/5ML PO SYRP: 5.0000 mL | ORAL_SOLUTION | Freq: Four times a day (QID) | ORAL | Status: AC | PRN

## 2014-08-06 MED ORDER — LEVALBUTEROL TARTRATE 45 MCG/ACT IN AERO: 2.0000 | INHALATION_SPRAY | RESPIRATORY_TRACT | Status: DC | PRN

## 2014-08-06 NOTE — Telephone Encounter (Signed)
lmomtcb for Onyeja on her named VM

## 2014-08-06 NOTE — Telephone Encounter (Signed)
Onyeja called back from Hospice and she stated that the pt will need hycodan, xopenex HFA and the tessalon sent to the pharmacy. rx has been sent in and hycodan has been printed out and will have MR to sign this and fax to the pharmacy. Nothing further is needed.

## 2014-08-07 ENCOUNTER — Ambulatory Visit (INDEPENDENT_AMBULATORY_CARE_PROVIDER_SITE_OTHER): Payer: Medicare Other | Admitting: Internal Medicine

## 2014-08-07 ENCOUNTER — Encounter: Payer: Self-pay | Admitting: Internal Medicine

## 2014-08-07 VITALS — BP 132/62 | HR 82 | Ht 60.0 in | Wt 170.0 lb

## 2014-08-07 DIAGNOSIS — J9610 Chronic respiratory failure, unspecified whether with hypoxia or hypercapnia: Secondary | ICD-10-CM

## 2014-08-07 DIAGNOSIS — J449 Chronic obstructive pulmonary disease, unspecified: Secondary | ICD-10-CM

## 2014-08-07 DIAGNOSIS — J4489 Other specified chronic obstructive pulmonary disease: Secondary | ICD-10-CM

## 2014-08-07 DIAGNOSIS — J9611 Chronic respiratory failure with hypoxia: Secondary | ICD-10-CM

## 2014-08-07 DIAGNOSIS — R0902 Hypoxemia: Secondary | ICD-10-CM

## 2014-08-07 MED ORDER — AZITHROMYCIN 250 MG PO TABS: 250.0000 mg | ORAL_TABLET | Freq: Every day | ORAL | Status: DC

## 2014-08-07 MED ORDER — PREDNISONE 10 MG PO TABS: ORAL_TABLET | ORAL | Status: DC

## 2014-08-07 NOTE — Progress Notes (Addendum)
Subjective:    Patient ID: Bridget Fry, female    DOB: 11-16-1931, 80 y.o.   MRN: [id number]  HPI      #Advanced COPD , DNAR, DNAI chronic respiratory failure - Fev1 10/15/11:  0.91L/52%    - nasal cannula o2, pred  daily, budesonide nebs, brovana neb, spiriva, and alb prn  - class 3-4 disabling dyspnea  -- tremors and tachycardia with albuterol; changed to xopenex nov 2012 - poor social support: lives alone, confused about medications - echo May 2011: EF 65%, gr 1 diast dysfun and mildly elevated PASP and c/w cor pulmonale - in home hospice since august 2012 - N. acetylcysteine started February 2014  # Recurrrent AECOPD - did not tolerate darilesp early 2012, has high baseline EOS - Nov 2012 OPD Rx - Feb 2013 - - feb 2013 - May 2013 - May 2014: Z-Pak called in telephone  #CLass 4 dyspnea  - morphine causes delirium  #Chronic low platelets  - 80s and 90s - 2012 onwards   OV 08/07/2014  Chief Complaint  Patient presents with  . Follow-up    Pt states her breathing has worsened since last OV. Pt c/o DOE, dry cough, green mucous when blowing nose. Pt c/o chest tightness on right upper chest.     Followup COPD with chronic respiratory failure. Last visit was in April 2015 and since then she has called several times asking for azithromycin and also prednisone for COPD exacerbation. This is despite of being on hospice care, chronic nebulizer therapy, oxygen therapy and alternate day  Prednisone. Urine today she feels that her COPD exacerbation is coming on. We discussed the possibility of daily azithromycin therapy as prophylaxis against COPD exacerbation and she is open to this idea. Has not yet had a flu shot but it is not available in our community .  Past medical history: She continues to have abdominal pain, some new onset rectal bleeding and nausea since her last visit. This will be addressed by her primary care physician.   Review of Systems    Constitutional: Negative for fever and unexpected weight change.  HENT: Positive for postnasal drip and rhinorrhea. Negative for congestion, dental problem, ear pain, nosebleeds, sinus pressure, sneezing, sore throat and trouble swallowing.   Eyes: Negative for redness and itching.  Respiratory: Positive for cough and shortness of breath. Negative for chest tightness and wheezing.   Cardiovascular: Negative for palpitations and leg swelling.  Gastrointestinal: Negative for nausea and vomiting.  Genitourinary: Negative for dysuria.  Musculoskeletal: Negative for joint swelling.  Skin: Negative for rash.  Neurological: Negative for headaches.  Hematological: Does not bruise/bleed easily.  Psychiatric/Behavioral: Negative for dysphoric mood. The patient is not nervous/anxious.    Current outpatient prescriptions:ACAI PO, Take 1 tablet by mouth daily., Disp: , Rfl: ;  ALOE VERA PO, Take 1 tablet by mouth daily., Disp: , Rfl: ;  arformoterol (BROVANA) 15 MCG/2ML NEBU, Take 2 mLs (15 mcg total) by nebulization 2 (two) times daily., Disp: 120 mL, Rfl: 6;  benzonatate (TESSALON) 200 MG capsule, TAKE ONE CAPSULE BY MOUTH THREE TIMES DAILY AS NEEDED. HOSPICE PATIENT, Disp: 90 capsule, Rfl: 0 budesonide (PULMICORT) 0.25 MG/2ML nebulizer solution, Take 2 mLs (0.25 mg total) by nebulization 2 (two) times daily., Disp: 120 mL, Rfl: 6;  Calcium Citrate (CITRACAL PO), Take 1 tablet by mouth daily., Disp: , Rfl: ;  cyanocobalamin (,VITAMIN B-12,) 1000 MCG/ML injection, INJECT ONE ML (CC) SUBCUTANEOUSLY ONCE EVERY MONTH, Disp: 1 mL, Rfl: 5  dextromethorphan-guaiFENesin (MUCINEX DM) 30-600 MG per 12 hr tablet, Take 1 tablet by mouth every 12 (twelve) hours.  , Disp: , Rfl: ;  diazepam (VALIUM) 5 MG tablet, Take 5 mg by mouth at bedtime as needed., Disp: , Rfl: ;  ferrous sulfate 325 (65 FE) MG tablet, Take 325 mg by mouth 2 (two) times daily with a meal. , Disp: , Rfl: ;  FLUoxetine (PROZAC) 20 MG tablet, Take 20 mg by  mouth 2 (two) times daily.  , Disp: , Rfl:  folic acid (FOLVITE) 1 MG tablet, Take 1 mg by mouth daily., Disp: , Rfl: ;  furosemide (LASIX) 40 MG tablet, One tablet daily, Disp: , Rfl: ;  glipiZIDE (GLUCOTROL) 5 MG tablet, Take 5 mg by mouth 2 (two) times daily before a meal. , Disp: , Rfl: ;  HYDROcodone-homatropine (HYCODAN) 5-1.5 MG/5ML syrup, Take 5 mLs by mouth every 6 (six) hours as needed. HOSPICE PATIENT, Disp: 120 mL, Rfl: 0 levalbuterol (XOPENEX HFA) 45 MCG/ACT inhaler, Inhale 2 puffs into the lungs as needed. HOSPICE PATIENT, Disp: 1 Inhaler, Rfl: 6;  meclizine (ANTIVERT) 12.5 MG tablet, Take 12.5 mg by mouth every 4 (four) hours as needed., Disp: , Rfl: ;  NON FORMULARY, Tumeric Tablets twice a day, Disp: , Rfl: ;  pantoprazole (PROTONIX) 40 MG tablet, Take 40 mg by mouth daily.  , Disp: , Rfl:  polyethylene glycol powder (GLYCOLAX/MIRALAX) powder, USE 17 GRAMS IN LIQUID EVERY DAY AS NEEDED TO PREVENT CONSTIPATION., Disp: 255 g, Rfl: 3;  potassium chloride (KLOR-CON) 10 MEQ CR tablet, Take 10 mEq by mouth daily.  , Disp: , Rfl: ;  promethazine (PHENERGAN) 25 MG tablet, 25 mg every 8 (eight) hours as needed. , Disp: , Rfl:  SENOKOT S 8.6-50 MG per tablet, TAKE ONE TO FOUR TABLETS BY MOUTH ONCE OR TWICE DAILY AS DIRECTED. DO NOT EXCEED 8 TABLETS IN 24 HOURS., Disp: 120 tablet, Rfl: 0;  sitaGLIPtin (JANUVIA) 50 MG tablet, Take 100 mg by mouth daily. , Disp: , Rfl: ;  tiotropium (SPIRIVA) 18 MCG inhalation capsule, Place 18 mcg into inhaler and inhale daily.  , Disp: , Rfl: ;  BIOTIN PO, Take 1 tablet by mouth daily., Disp: , Rfl:  predniSONE (DELTASONE) 10 MG tablet, Take 40 mg daily x 2 days, 20 mg daily x 2 days, 10 mg daily x2days, 5 mg daily x 2 days then continue to baseline dose, Disp: 15 tablet, Rfl: 0;  predniSONE (DELTASONE) 10 MG tablet, Take 0.5 tablets (5 mg total) by mouth daily with breakfast.  every other day., Disp: 30 tablet, Rfl: 2     Objective:   Physical Exam  Filed  Vitals:   08/07/14 1359  BP: 132/62  Pulse: 82  Height: 5' (1.524 m)  Weight: 170 lb (77.111 kg)  SpO2: 97%    Constitutional: She is oriented to person, place, and time. She appears well-developed and well-nourished. No distress.  Obese Sitting in wheel chair  HENT:  Head: Normocephalic and atraumatic.  Right Ear: External ear normal.  Left Ear: External ear normal.  Mouth/Throat: Oropharynx is clear and moist. No oropharyngeal exudate.  Eyes: Conjunctivae and EOM are normal. Pupils are equal, round, and reactive to light. Right eye exhibits no discharge. Left eye exhibits no discharge. No scleral icterus.  Neck: Normal range of motion. Neck supple. No JVD present. No tracheal deviation present. No thyromegaly present.  Cardiovascular: Normal rate, regular rhythm, normal heart sounds and intact distal pulses.  Exam reveals no  gallop and no friction rub.   No murmur heard. Pulmonary/Chest: Effort normal and breath sounds normal. No respiratory distress. She has no wheezes. She has no rales. She exhibits no tenderness.  barreel chest Purse lip breathing  Abdominal: Soft. Bowel sounds are normal. She exhibits no distension and no mass. There is no tenderness. There is no rebound and no guarding.  Musculoskeletal: Normal range of motion. She exhibits no edema and no tenderness.  Lymphadenopathy:    She has no cervical adenopathy.  Neurological: She is alert and oriented to person, place, and time. She has normal reflexes. No cranial nerve deficit. She exhibits normal muscle tone. Coordination normal.  Skin: Skin is warm and dry. No rash noted. She is not diaphoretic. No erythema. No pallor.   Psychiatric: She has a normal mood and affect. Her behavior is normal. Judgment and thought content normal.          Assessment & Plan:  #COPD and chronic respiratory failure - continue oxygen and nebulizers - Please take prednisone 40 mg x1 day, then 30 mg x1 day, then 20 mg x1 day, then  10 mg x1 day, and then 5 mg x1 day and then alternate day in case you are in copd flare up - due to recurrent copd exacerbations start daily azithromycin  per day; take after food   - any side effects: call us [phone] - Overall I Am concerned that you are in a declining trend; life expectancy continues to be < 6 months  #ABdominal pain, Nausea, Rectal bleeding  - per Dr Eula Listen     #FOllowup 1 months or sooner if needed; see my NP Tammy at followup   > 50% of this > 25 min visit spent in face to face counseling (15 min visit converted to 25 min)

## 2014-08-07 NOTE — Patient Instructions (Addendum)
#  COPD and chronic respiratory failure - continue oxygen and nebulizers - Please take prednisone 40 mg x1 day, then 30 mg x1 day, then 20 mg x1 day, then 10 mg x1 day, and then 5 mg x1 day and then alternate day in case you are in copd flare up - due to recurrent copd exacerbations start daily azithromycin  per day; take after food   - any side effects: call us [phone]  #ABdominal pain, Nausea, Rectal bleeding  - per Dr Eula Listen     #FOllowup 1 months or sooner if needed; see my NP Tammy at followup

## 2014-08-07 NOTE — Assessment & Plan Note (Signed)
#  COPD and chronic respiratory failure - continue oxygen and nebulizers - Please take prednisone 40 mg x1 day, then 30 mg x1 day, then 20 mg x1 day, then 10 mg x1 day, and then 5 mg x1 day and then alternate day in case you are in copd flare up - due to recurrent copd exacerbations start daily azithromycin 250mg per day; take after food   - any side effects: call us [phone]  #ABdominal pain, Nausea, Rectal bleeding  - per Dr Hussain     #FOllowup 1 months or sooner if needed; see my NP Tammy at followup  

## 2014-08-10 DIAGNOSIS — J449 Chronic obstructive pulmonary disease, unspecified: Secondary | ICD-10-CM | POA: Insufficient documentation

## 2014-09-04 ENCOUNTER — Encounter: Payer: Self-pay | Admitting: Internal Medicine

## 2014-09-04 ENCOUNTER — Ambulatory Visit: Payer: Medicare Other | Admitting: Internal Medicine

## 2014-09-04 ENCOUNTER — Ambulatory Visit (INDEPENDENT_AMBULATORY_CARE_PROVIDER_SITE_OTHER): Payer: Medicare Other | Admitting: Internal Medicine

## 2014-09-04 VITALS — BP 118/70 | HR 76 | Ht 60.0 in | Wt 169.0 lb

## 2014-09-04 DIAGNOSIS — J9610 Chronic respiratory failure, unspecified whether with hypoxia or hypercapnia: Secondary | ICD-10-CM | POA: Diagnosis not present

## 2014-09-04 DIAGNOSIS — R0902 Hypoxemia: Secondary | ICD-10-CM | POA: Diagnosis not present

## 2014-09-04 DIAGNOSIS — J9611 Chronic respiratory failure with hypoxia: Secondary | ICD-10-CM

## 2014-09-04 NOTE — Progress Notes (Signed)
Subjective:    Patient ID: Bridget Fry, female    DOB: [birth year], 80 y.o.   MRN: [id number]  HPI    #Advanced COPD , DNAR, DNAI chronic respiratory failure - Fev1 10/15/11:  0.91L/52%    - nasal cannula o2, pred  daily, budesonide nebs, brovana neb, spiriva, and alb prn  - class 3-4 disabling dyspnea  -- tremors and tachycardia with albuterol; changed to xopenex nov 2012 - poor social support: lives alone, confused about medications - echo May 2011: EF 65%, gr 1 diast dysfun and mildly elevated PASP and c/w cor pulmonale - in home hospice since august 2012 - N. acetylcysteine started February 2014  # Recurrrent AECOPD - did not tolerate darilesp early 2012, has high baseline EOS - Nov 2012 OPD Rx - Feb 2013 - - feb 2013 - May 2013 - May 2014: Z-Pak called in telephone  #CLass 4 dyspnea  - morphine causes delirium  #Chronic low platelets  - 80s and 90s - 2012 onwards   OV 09/04/2014  Chief Complaint  Patient presents with  . Follow-up    Pt states her breathing has worsened since last OV. Pt states the azithromycin isn't helping.Pt c/o dizziness, SOB, dry cough and chest tightness intermittently.     FU COPD, chronic resp failur e hospice care on nebs and  scheduld pred  - last ov 1 month ago started daily zpak in addition to NAC due to recurrent AECOPD. She says since then worsening of baseline nausea. REsp statys is bad but stable -class 4 dyspnea unchanged. Overall quality of life i miserable.  She wans flu shot only in [employer] History  Administered Date(s) Administered  . Influenza Split 09/01/2012  . Influenza Whole 08/20/2010, 08/14/2011  . Influenza,inj,Quad PF,36+ Mos 08/24/2013  . Pneumococcal Conjugate-13 02/20/2014  . Pneumococcal Polysaccharide-23 05/18/2010     Review of Systems  Constitutional: Negative for fever and unexpected weight change.  HENT: Negative for congestion, dental problem, ear pain, nosebleeds, postnasal  drip, rhinorrhea, sinus pressure, sneezing, sore throat and trouble swallowing.   Eyes: Negative for redness and itching.  Respiratory: Positive for cough, chest tightness and shortness of breath. Negative for wheezing.   Cardiovascular: Negative for palpitations and leg swelling.  Gastrointestinal: Positive for nausea. Negative for vomiting.  Genitourinary: Negative for dysuria.  Musculoskeletal: Negative for joint swelling.  Skin: Negative for rash.  Neurological: Positive for dizziness. Negative for headaches.  Hematological: Does not bruise/bleed easily.  Psychiatric/Behavioral: Negative for dysphoric mood. The patient is not nervous/anxious.        Objective:   Physical Exam  Filed Vitals:   09/04/14 1513  BP: 118/70  Pulse: 76  Height: 5' (1.524 m)  Weight: 169 lb (76.658 kg)  SpO2: 96%    Constitutional: She is oriented to person, place, and time. She appears well-developed and well-nourished. No distress.  Obese Sitting in wheel chair  HENT:  Head: Normocephalic and atraumatic.  Right Ear: External ear normal.  Left Ear: External ear normal.  Mouth/Throat: Oropharynx is clear and moist. No oropharyngeal exudate.  Eyes: Conjunctivae and EOM are normal. Pupils are equal, round, and reactive to light. Right eye exhibits no discharge. Left eye exhibits no discharge. No scleral icterus.  Neck: Normal range of motion. Neck supple. No JVD present. No tracheal deviation present. No thyromegaly present.  Cardiovascular: Normal rate, regular rhythm, normal heart sounds and intact distal pulses.  Exam reveals no gallop and no friction rub.   No  murmur heard. Pulmonary/Chest: Effort normal and breath sounds normal. No respiratory distress. She has no wheezes. She has no rales. She exhibits no tenderness.  barreel chest Purse lip breathing  Abdominal: Soft. Bowel sounds are normal. She exhibits no distension and no mass. There is no tenderness. There is no rebound and no guarding.   Musculoskeletal: Normal range of motion. She exhibits no edema and no tenderness.  Lymphadenopathy:    She has no cervical adenopathy.  Neurological: She is alert and oriented to person, place, and time. She has normal reflexes. No cranial nerve deficit. She exhibits normal muscle tone. Coordination normal.  Skin: Skin is warm and dry. No rash noted. She is not diaphoretic. No erythema. No pallor.   Psychiatric: She has a normal mood and affect. Her behavior is normal. Judgment and thought content normal.          Assessment & Plan:  #COPD and chronic respiratory failure - continue oxygen and nebulizers - due to nausea stop NAC and daily azithromycin - any problems : call us [phone] - recommend flu shot 09/04/2014  #ABdominal pain, Nausea, Rectal bleeding  -stop NAC And azithromycin  - rest per Georgann Housekeeper, MD    #FOllowup 2 months or sooner if need

## 2014-09-04 NOTE — Patient Instructions (Addendum)
#  COPD and chronic respiratory failure - continue oxygen and nebulizers - due to nausea stop NAC and daily azithromycin - any problems : call us [phone] - recommend flu shot 09/04/2014  #ABdominal pain, Nausea, Rectal bleeding  -stop NAC And azithromycin  - rest per HUSAIN,KARRAR, MD    #FOllowup 2 months or sooner if needed;

## 2014-10-08 ENCOUNTER — Other Ambulatory Visit: Payer: Self-pay | Admitting: Internal Medicine

## 2014-10-09 ENCOUNTER — Telehealth: Payer: Self-pay | Admitting: Internal Medicine

## 2014-10-09 MED ORDER — BUDESONIDE 0.25 MG/2ML IN SUSP: 0.2500 mg | Freq: Two times a day (BID) | RESPIRATORY_TRACT | Status: DC

## 2014-10-09 MED ORDER — ARFORMOTEROL TARTRATE 15 MCG/2ML IN NEBU: 15.0000 ug | INHALATION_SOLUTION | Freq: Two times a day (BID) | RESPIRATORY_TRACT | Status: DC

## 2014-10-09 NOTE — Telephone Encounter (Signed)
Called and spoke to pt. Pt requesting brovana and pulmicort neb set to pharmacy. Rx sent to preferred pharmacy. Pt verbalized understanding and denied any further questions or concerns at this time.

## 2014-10-23 ENCOUNTER — Ambulatory Visit: Payer: Medicare Other | Admitting: Internal Medicine

## 2014-10-24 ENCOUNTER — Other Ambulatory Visit: Payer: Self-pay | Admitting: Internal Medicine

## 2014-10-24 DIAGNOSIS — M79604 Pain in right leg: Secondary | ICD-10-CM

## 2014-10-24 DIAGNOSIS — M7989 Other specified soft tissue disorders: Secondary | ICD-10-CM

## 2014-10-29 ENCOUNTER — Ambulatory Visit
Admission: RE | Admit: 2014-10-29 | Discharge: 2014-10-29 | Disposition: A | Discharge status: Home / Self Care | Payer: Medicare Other | Source: Ambulatory Visit | Attending: Internal Medicine | Admitting: Internal Medicine

## 2014-10-29 DIAGNOSIS — M7989 Other specified soft tissue disorders: Secondary | ICD-10-CM

## 2014-10-29 DIAGNOSIS — M79604 Pain in right leg: Secondary | ICD-10-CM

## 2014-10-30 ENCOUNTER — Other Ambulatory Visit: Payer: Self-pay | Admitting: Internal Medicine

## 2014-11-12 ENCOUNTER — Ambulatory Visit (INDEPENDENT_AMBULATORY_CARE_PROVIDER_SITE_OTHER): Payer: Medicare Other | Admitting: Internal Medicine

## 2014-11-12 ENCOUNTER — Encounter: Payer: Self-pay | Admitting: Internal Medicine

## 2014-11-12 VITALS — BP 150/82 | HR 85 | Ht 60.0 in | Wt 166.0 lb

## 2014-11-12 DIAGNOSIS — J449 Chronic obstructive pulmonary disease, unspecified: Secondary | ICD-10-CM

## 2014-11-12 DIAGNOSIS — J9611 Chronic respiratory failure with hypoxia: Secondary | ICD-10-CM

## 2014-11-12 MED ORDER — AZITHROMYCIN 250 MG PO TABS: ORAL_TABLET | ORAL | Status: DC

## 2014-11-12 NOTE — Patient Instructions (Addendum)
#  COPD and chronic respiratory failure - stable versus resolving exacerbation - continue oxygen and nebulizers - Finish 2nd part of ZPAK - nurse will send script with  4 refills - any problems : call us [phone]   #FOllowup 3 months with NP Tammy at next visit

## 2014-11-12 NOTE — Progress Notes (Signed)
Subjective:    Patient ID: Bridget Fry, female    DOB: [birth year], 80 y.o.   MRN: [id number]  HPI   OV 11/12/2014   Chief Complaint  Patient presents with  . Follow-up    Pt states her breathing has worsened since last OV. Pt c/o increase in SOB, dry cough and chest tightness.     Follow-up COPD and chronic respiratory failure, DNR/DNI hospice care  - She continues to feel poorly. Her caretaker with her feels overall stable and fair but patient says that she is slowly declining. She feels that she is in the midst of another exacerbation. She's finished one Z-Pak course. She usually takes 2 Z-Pak courses. Daily azithromycin did not work for her. She wants a second azithromycin currently; Z-Pak  Past  medical history: She did have some pedal edema and primary care physician did a Doppler ultrasound and rule out Z-Pak   Review of Systems  Constitutional: Negative for fever and unexpected weight change.  HENT: Negative for congestion, dental problem, ear pain, nosebleeds, postnasal drip, rhinorrhea, sinus pressure, sneezing, sore throat and trouble swallowing.   Eyes: Negative for redness and itching.  Respiratory: Positive for cough and shortness of breath. Negative for chest tightness and wheezing.   Cardiovascular: Negative for palpitations and leg swelling.  Gastrointestinal: Negative for nausea and vomiting.  Genitourinary: Negative for dysuria.  Musculoskeletal: Negative for joint swelling.  Skin: Negative for rash.  Neurological: Negative for headaches.  Hematological: Does not bruise/bleed easily.  Psychiatric/Behavioral: Negative for dysphoric mood. The patient is not nervous/anxious.    Current outpatient prescriptions: ACAI PO, Take 1 tablet by mouth daily., Disp: , Rfl: ;  ALOE VERA PO, Take 1 tablet by mouth daily., Disp: , Rfl: ;  arformoterol (BROVANA) 15 MCG/2ML NEBU, Take 2 mLs (15 mcg total) by nebulization 2 (two) times daily., Disp: 120 mL, Rfl: 6;   azithromycin (ZITHROMAX) 250 MG tablet, Take 1 tablet (250 mg total) by mouth daily., Disp: 30 tablet, Rfl: 4 benzonatate (TESSALON) 200 MG capsule, TAKE ONE CAPSULE BY MOUTH THREE TIMES DAILY AS NEEDED. HOSPICE PATIENT, Disp: 90 capsule, Rfl: 0;  BIOTIN PO, Take 1 tablet by mouth daily., Disp: , Rfl: ;  budesonide (PULMICORT) 0.25 MG/2ML nebulizer solution, Take 2 mLs (0.25 mg total) by nebulization 2 (two) times daily., Disp: 120 mL, Rfl: 6;  cyanocobalamin (,VITAMIN B-12,) 1000 MCG/ML injection, INJECT 1 ML INTO THE SKIN ONCE A MONTH, Disp: 10 mL, Rfl: 0 dextromethorphan-guaiFENesin (MUCINEX DM) 30-600 MG per 12 hr tablet, Take 1 tablet by mouth every 12 (twelve) hours.  , Disp: , Rfl: ;  diazepam (VALIUM) 5 MG tablet, Take 5 mg by mouth at bedtime as needed., Disp: , Rfl: ;  ferrous sulfate 325 (65 FE) MG tablet, Take 325 mg by mouth 2 (two) times daily with a meal. , Disp: , Rfl: ;  FLUoxetine (PROZAC) 20 MG tablet, Take 20 mg by mouth 2 (two) times daily.  , Disp: , Rfl:  folic acid (FOLVITE) 1 MG tablet, Take 1 mg by mouth daily., Disp: , Rfl: ;  furosemide (LASIX) 40 MG tablet, One tablet daily (Patient taking differently: 1.5 mg. One tablet daily), Disp: , Rfl: ;  glipiZIDE (GLUCOTROL) 5 MG tablet, Take 5 mg by mouth 2 (two) times daily before a meal. , Disp: , Rfl: ;  HYDROcodone-homatropine (HYCODAN) 5-1.5 MG/5ML syrup, Take 5 mLs by mouth every 6 (six) hours as needed. HOSPICE PATIENT, Disp: 120 mL, Rfl: 0 levalbuterol (XOPENEX HFA)  45 MCG/ACT inhaler, Inhale 2 puffs into the lungs as needed. HOSPICE PATIENT, Disp: 1 Inhaler, Rfl: 6;  meclizine (ANTIVERT) 12.5 MG tablet, Take 12.5 mg by mouth every 4 (four) hours as needed., Disp: , Rfl: ;  NON FORMULARY, Tumeric Tablets twice a day, Disp: , Rfl: ;  pantoprazole (PROTONIX) 40 MG tablet, Take 40 mg by mouth daily.  , Disp: , Rfl:  polyethylene glycol powder (GLYCOLAX/MIRALAX) powder, USE 17 GRAMS IN LIQUID EVERY DAY AS NEEDED TO PREVENT  CONSTIPATION., Disp: 255 g, Rfl: 3;  potassium chloride (KLOR-CON) 10 MEQ CR tablet, Take 10 mEq by mouth daily.  , Disp: , Rfl: ;  predniSONE (DELTASONE) 10 MG tablet, Take 0.5 tablets (5 mg total) by mouth daily with breakfast. 5mg  every other day., Disp: 30 tablet, Rfl: 2 promethazine (PHENERGAN) 25 MG tablet, 25 mg every 8 (eight) hours as needed. , Disp: , Rfl: ;  SENOKOT S 8.6-50 MG per tablet, TAKE ONE TO FOUR TABLETS BY MOUTH ONCE OR TWICE DAILY AS DIRECTED. DO NOT EXCEED 8 TABLETS IN 24 HOURS., Disp: 120 tablet, Rfl: 0;  sitaGLIPtin (JANUVIA) 50 MG tablet, Take 100 mg by mouth daily. , Disp: , Rfl: ;  tiotropium (SPIRIVA) 18 MCG inhalation capsule, Place 18 mcg into inhaler and inhale daily.  , Disp: , Rfl:      Objective:   Physical Exam  Filed Vitals:   11/12/14 1152  BP: 150/82  Pulse: 85  Height: 5' (1.524 m)  Weight: 166 lb (75.297 kg)  SpO2: 98%     Constitutional: She is oriented to person, place, and time. She appears well-developed and well-nourished. No distress.  Obese Sitting in wheel chair  HENT:  Head: Normocephalic and atraumatic.  Right Ear: External ear normal.  Left Ear: External ear normal.  Mouth/Throat: Oropharynx is clear and moist. No oropharyngeal exudate.  Eyes: Conjunctivae and EOM are normal. Pupils are equal, round, and reactive to light. Right eye exhibits no discharge. Left eye exhibits no discharge. No scleral icterus.  Neck: Normal range of motion. Neck supple. No JVD present. No tracheal deviation present. No thyromegaly present.  Cardiovascular: Normal rate, regular rhythm, normal heart sounds and intact distal pulses.  Exam reveals no gallop and no friction rub.   No murmur heard. Pulmonary/Chest: Effort normal and breath sounds normal. No respiratory distress. She has no wheezes. She has no rales. She exhibits no tenderness.  barreel chest Purse lip breathing  Abdominal: Soft. Bowel sounds are normal. She exhibits no distension and no mass.  There is no tenderness. There is no rebound and no guarding.  Musculoskeletal: Normal range of motion. She exhibits no edema and no tenderness.  Lymphadenopathy:    She has no cervical adenopathy.  Neurological: She is alert and oriented to person, place, and time. She has normal reflexes. No cranial nerve deficit. She exhibits normal muscle tone. Coordination normal.  Skin: Skin is warm and dry. No rash noted. She is not diaphoretic. No erythema. No pallor.   Psychiatric: She has a normal mood and affect. Her behavior is normal. Judgment and thought content normal.         Assessment & Plan:     ICD-9-CM ICD-10-CM   1. Chronic respiratory failure with hypoxia 518.83 J96.11    799.02    2. Chronic obstructive pulmonary disease, unspecified COPD, unspecified chronic bronchitis type 496 J44.9    #COPD and chronic respiratory failure - stable versus resolving exacerbation - continue oxygen and nebulizers - Finish 2nd  part of ZPAK - nurse will send script with  4 refills - any problems : call us [phone]   #FOllowup 3 months with NP Tammy at next visit   Dr. Kalman ShanMurali Keeton Kassebaum, M.D., [facility]C.P Pulmonary and Critical Care Medicine Staff Physician [facility] [place] [facility] Pager: [phone], If no answer or between  15:00h - 7:00h: call [phone]  11/12/2014 12:24 PM

## 2014-11-19 ENCOUNTER — Other Ambulatory Visit: Payer: Self-pay | Admitting: Internal Medicine

## 2014-11-30 ENCOUNTER — Other Ambulatory Visit: Payer: Self-pay | Admitting: Nurse Practitioner

## 2014-12-17 ENCOUNTER — Telehealth: Payer: Self-pay | Admitting: Internal Medicine

## 2014-12-17 NOTE — Telephone Encounter (Signed)
Called spoke w/ pt hospice nurse. She reports pt is more SOB at all times. She is having to lean forward to help her breath better. Dr. Gibson RampFeldman thinks we should increase her budesonide and brovana. She currently does both BID. She also has xopenex PRN and uses spiriva QD. Please advise MR thanks  Allergies  Allergen Reactions  . Albuterol Sulfate     Increased heart rate  . Codeine     REACTION: nausea  . Morphine And Related   . Tetracycline     REACTION: rash  . Xopenex [Levalbuterol]     Nebulizer caused nervousness, inhaler ok

## 2014-12-17 NOTE — Telephone Encounter (Signed)
She needs opioid for dyspnea strated by hospice - but she refuses. Hospioce should talk to her again  Otherwise recommend   dc spiriva  -do duoneb q4h - do pulmicort bid - do albuterol neb prn  Dr. Kalman ShanMurali Fate Caster, M.D., [facility]C.P Pulmonary and Critical Care Medicine Staff Physician [facility] [place] [facility] Pager: [phone], If no answer or between  15:00h - 7:00h: call [phone]  12/17/2014 5:47 PM

## 2014-12-17 NOTE — Telephone Encounter (Signed)
Spoke with Hospice nurse-she will speak with patient on Friday and will give suggestion to Hospice MD tomorrow; will suggest opioid to patient again and see what they can work out otherwise will go alternate route MR is giving. Hospice nurse to contact our office back on Friday to let us know the outcome.

## 2014-12-20 ENCOUNTER — Telehealth: Payer: Self-pay | Admitting: Internal Medicine

## 2014-12-20 NOTE — Telephone Encounter (Signed)
Called and spoke with oeyeje from hospice and she was calling with a weekly update on the pt today  She stated that the pt does not want to take the morphine for the SOB at this time They did d/c the spiriva duoneb is every 4 hours pulmicort is BID brovanna is PRN.  She will call back weekly and as needed with updates for MR.  Will forward to MR to make him aware.

## 2014-12-24 ENCOUNTER — Telehealth: Payer: Self-pay | Admitting: Internal Medicine

## 2014-12-24 NOTE — Telephone Encounter (Signed)
Ok to send in valium script

## 2014-12-24 NOTE — Telephone Encounter (Signed)
Spoke with Onyeje at hospice, states that pt has been having increased difficulty falling and staying asleep.  She was prescribed Valium 5mg  for this in 2012, and is [facility] [address]. Last ov:  11/12/14 Next ov: 02/13/15  MR are you ok with this refill?  Thanks!

## 2014-12-25 MED ORDER — DIAZEPAM 5 MG PO TABS: 5.0000 mg | ORAL_TABLET | Freq: Every evening | ORAL | Status: AC | PRN

## 2014-12-25 NOTE — Telephone Encounter (Signed)
Rx has been called in. Bridget Fry is aware that his has been done.

## 2015-02-13 ENCOUNTER — Ambulatory Visit: Payer: Medicare Other | Admitting: Adult Health

## 2015-02-14 ENCOUNTER — Ambulatory Visit: Admitting: Adult Health

## 2015-02-14 ENCOUNTER — Telehealth: Payer: Self-pay | Admitting: Internal Medicine

## 2015-02-14 NOTE — Telephone Encounter (Signed)
[facility] - Patient went to see Dr. Gibson RampFeldman for O2 Sats today.  Pt complained that she felt tightness in her chest, felt heavy like elephant sitting on chest.  Dr. Gibson RampFeldman felt like pt would do better with short acting nebulizer medication. Pt is currently using Brovana, Pulmicort and Xopenex.    MR- please advise.

## 2015-02-14 NOTE — Telephone Encounter (Signed)
Left message for patient to call back  

## 2015-02-14 NOTE — Telephone Encounter (Signed)
Bridget Fry has returned call - [phone]

## 2015-02-18 NOTE — Telephone Encounter (Signed)
[phone], Onyeje cb

## 2015-02-19 NOTE — Telephone Encounter (Signed)
Ok to send in albuterol 2.5 mg by [facility] q6h prn only

## 2015-02-19 NOTE — Telephone Encounter (Signed)
Spoke with Onyeje at hospice, is still requesting a short acting neb for pt as she is having continuing chest tightness and sob accompanying this.  Pt uses [facility] on [address].   Please call Onyeje to keep her updated with pt's medications.    Sending to doc of day as MR is out of the office for the next several days.    KC please advise.  Thanks!

## 2015-02-20 MED ORDER — ALBUTEROL SULFATE (2.5 MG/3ML) 0.083% IN NEBU: 2.5000 mg | INHALATION_SOLUTION | Freq: Four times a day (QID) | RESPIRATORY_TRACT | Status: DC | PRN

## 2015-02-20 NOTE — Telephone Encounter (Signed)
[facility] Onyeje is aware. Nothing further was needed.

## 2015-02-21 ENCOUNTER — Telehealth: Payer: Self-pay | Admitting: Internal Medicine

## 2015-02-21 MED ORDER — ARFORMOTEROL TARTRATE 15 MCG/2ML IN NEBU: 15.0000 ug | INHALATION_SOLUTION | Freq: Two times a day (BID) | RESPIRATORY_TRACT | Status: DC

## 2015-02-21 NOTE — Telephone Encounter (Signed)
Per Hospice nurse, pt needs refill on her Brovana.  [facility] do not have any Bro[facility]ana currently.  Pharmacy said they would [facility] the [facility] and it should be in by Monday (it takes approx 3 days to come in).  Patient and pt's hospice nurse both have been notified.  Nothing further needed.

## 2015-03-04 ENCOUNTER — Telehealth: Payer: Self-pay | Admitting: Internal Medicine

## 2015-03-04 MED ORDER — LEVALBUTEROL HCL 0.63 MG/3ML IN NEBU: 0.6300 mg | INHALATION_SOLUTION | Freq: Four times a day (QID) | RESPIRATORY_TRACT | Status: DC | PRN

## 2015-03-04 NOTE — Telephone Encounter (Signed)
Called and spoke to Bridget Fry with Hospice. Informed her of the change in rx. [facility] Bridget Fry stated she would work out the payment. Nothing further needed at this time.

## 2015-03-04 NOTE — Telephone Encounter (Signed)
Pt can't use albuterol, it's making her heart rate to high. They would like an alternative.  MR - please advise. Thanks.

## 2015-03-04 NOTE — Telephone Encounter (Signed)
xopenex 0.63 dose but hospice will have to pauy for it

## 2015-03-06 ENCOUNTER — Other Ambulatory Visit: Payer: Self-pay | Admitting: Internal Medicine

## 2015-03-06 DIAGNOSIS — R7989 Other specified abnormal findings of blood chemistry: Secondary | ICD-10-CM

## 2015-03-06 DIAGNOSIS — R945 Abnormal results of liver function studies: Principal | ICD-10-CM

## 2015-03-14 ENCOUNTER — Other Ambulatory Visit: Payer: Medicare Other

## 2015-03-17 ENCOUNTER — Ambulatory Visit
Admission: RE | Admit: 2015-03-17 | Discharge: 2015-03-17 | Disposition: A | Discharge status: Home / Self Care | Payer: Medicare Other | Source: Ambulatory Visit | Attending: Internal Medicine | Admitting: Internal Medicine

## 2015-03-17 DIAGNOSIS — R7989 Other specified abnormal findings of blood chemistry: Secondary | ICD-10-CM

## 2015-03-17 DIAGNOSIS — R945 Abnormal results of liver function studies: Principal | ICD-10-CM

## 2015-03-24 LAB — HM DIABETES EYE EXAM

## 2015-04-08 ENCOUNTER — Telehealth: Payer: Self-pay | Admitting: Internal Medicine

## 2015-04-08 NOTE — Telephone Encounter (Signed)
Spoke with patient-she is aware to contact [employer] for papers that MR will need to complete for her. She will call them today.

## 2015-05-01 ENCOUNTER — Telehealth: Payer: Self-pay | Admitting: Internal Medicine

## 2015-05-01 NOTE — Telephone Encounter (Signed)
Bridget Fry  Please have hospice RN or doc Bridget Fry call me on my cell about Bridget DredgeVictoria Fry   Thanks  Bridget Fry, M.D., [facility] Pulmonary and Critical Care Medicine Staff Physician [facility] [place] Pulmonary and Critical Care Pager: [phone], If no answer or between  15:00h - 7:00h: call [phone]  05/01/2015 6:35 PM

## 2015-05-02 NOTE — Telephone Encounter (Signed)
Called Hospice at [phone] and left message for Onyeje.

## 2015-05-05 NOTE — Telephone Encounter (Signed)
lmtcb for Onyeje.  

## 2015-05-06 NOTE — Telephone Encounter (Signed)
Return call can be reached @ [phone] or [phone].Caren GriffinsStanley A Dalton

## 2015-05-06 NOTE — Telephone Encounter (Signed)
lmtcb x3 

## 2015-05-07 NOTE — Telephone Encounter (Signed)
Left a detailed message with Onyeje to have Dr Gibson RampFeldman contact Dr Marchelle Gearingamaswamy to discuss a mutual patient. Left call back number for our office as well as Dr Jane Canaryamaswamy's Cell #

## 2015-05-09 NOTE — Telephone Encounter (Signed)
Spoke with Bridget Fry. Dr Gibson RampFeldman tried to call Bridget Fry on 05/06/15 but he could not leave a voicemail as Bridget Fry's box was full. Bridget Fry stated the best way for Bridget Fry to speak with Dr. Gibson RampFeldman is to call [phone] and let the receptionist know Bridget Fry needs to speak with Dr. Gibson RampFeldman ASAP and they will give his cell number.   Bridget Fry please advise.

## 2015-05-13 NOTE — Telephone Encounter (Signed)
I already spoke to Dr Gibson RampFeldman about Bridget DredgeVictoria Fry. I was concerned that patient has been in hospice tooo long and has outlived 6 month life expectancy several  6 month perioid. Dr Gibson RampFeldman said she has declined significantly and will continue to benefit from hospice.

## 2015-05-20 ENCOUNTER — Other Ambulatory Visit: Payer: Self-pay | Admitting: Internal Medicine

## 2015-07-08 ENCOUNTER — Other Ambulatory Visit: Payer: Self-pay | Admitting: Internal Medicine

## 2015-08-05 ENCOUNTER — Other Ambulatory Visit: Payer: Self-pay | Admitting: Internal Medicine

## 2015-08-19 ENCOUNTER — Other Ambulatory Visit: Payer: Self-pay | Admitting: Internal Medicine

## 2015-09-02 ENCOUNTER — Other Ambulatory Visit: Payer: Self-pay | Admitting: Internal Medicine

## 2015-09-09 ENCOUNTER — Telehealth: Payer: Self-pay | Admitting: Internal Medicine

## 2015-09-09 NOTE — Telephone Encounter (Signed)
Attempted to call hospice and inform that pt needs OV No answer Will call back later

## 2015-09-09 NOTE — Telephone Encounter (Signed)
patuent needs to come in for an OV and then we can address. First avail with me or TP

## 2015-09-09 NOTE — Telephone Encounter (Signed)
Spoke with Onjey from hospice. She reports Dr. Gibson Ramp and her both had visit with pt today. Pt is no longer eligible for hospice since it has been over 6 months. Bridget Fry is requesting MR put in for a palliative care consult on pt. Please advise thanks

## 2015-09-10 NOTE — Telephone Encounter (Signed)
Called pt and she is scheduled to come in and see MR on 10/14 at 4pm. [facility] and is aware pt coming in for visit to discuss. Nothing further needed

## 2015-09-11 ENCOUNTER — Telehealth: Payer: Self-pay | Admitting: Internal Medicine

## 2015-09-11 DIAGNOSIS — J449 Chronic obstructive pulmonary disease, unspecified: Secondary | ICD-10-CM

## 2015-09-11 DIAGNOSIS — J9611 Chronic respiratory failure with hypoxia: Secondary | ICD-10-CM

## 2015-09-11 NOTE — Telephone Encounter (Signed)
Order has been placed. MR, will you please cosign order? thanks

## 2015-09-11 NOTE — Telephone Encounter (Signed)
Bridget Fry Given w/ hospice. She needs [place] to send orders to [facility] for pt O2. Needs liter flow, cont 24/7. MR is it okay to send for pt to be on 3 liters 24/7 or is pt on diff setting? thanks

## 2015-09-11 NOTE — Telephone Encounter (Signed)
Yes please send an order for oxygen to a home care company. Patient is transitioning out of hospice

## 2015-09-11 NOTE — Telephone Encounter (Signed)
Ok thanks 

## 2015-09-12 ENCOUNTER — Telehealth: Payer: Self-pay | Admitting: Internal Medicine

## 2015-09-12 ENCOUNTER — Other Ambulatory Visit: Payer: Self-pay | Admitting: Internal Medicine

## 2015-09-12 DIAGNOSIS — E539 Vitamin B deficiency, unspecified: Secondary | ICD-10-CM

## 2015-09-12 MED ORDER — LEVALBUTEROL TARTRATE 45 MCG/ACT IN AERO
2.0000 | INHALATION_SPRAY | RESPIRATORY_TRACT | Status: DC | PRN
Start: 2015-09-12 — End: 2015-09-26

## 2015-09-12 MED ORDER — ARFORMOTEROL TARTRATE 15 MCG/2ML IN NEBU: 15.0000 ug | INHALATION_SOLUTION | Freq: Two times a day (BID) | RESPIRATORY_TRACT | Status: DC

## 2015-09-12 MED ORDER — BUDESONIDE 0.25 MG/2ML IN SUSP: RESPIRATORY_TRACT | Status: DC

## 2015-09-12 NOTE — Telephone Encounter (Signed)
Called and spoke with pt Requested refill on Brovana, budesonide, and xoponex HFA Informed pt that refill would be sent to pharmacy  Refills sent electronically to pharmacy Nothing further is needed

## 2015-09-15 ENCOUNTER — Telehealth: Payer: Self-pay | Admitting: Internal Medicine

## 2015-09-15 NOTE — Telephone Encounter (Signed)
Called and spoke with pt Pt stated that she already has an appt with MR on 09/26/15 at 4pm Pt states wanting to keep this appt and not schedule with TP at this time  Nothing further is needed at this time

## 2015-09-15 NOTE — Telephone Encounter (Signed)
Give her copd appt for transition of care reasons - to see TP this week. Cannot be done voer phone; too complex

## 2015-09-15 NOTE — Telephone Encounter (Signed)
Spoke with pt, states she is no longer under Hospice care as of this past Friday- pt is unaware of what to do with her meds.  Pt states she cannot afford her medications and is particularly concerned about not being able to afford her nebulizer meds.  Pt is requesting that MR look at her neb meds and see what can be consolidated, or if he has any recommendations on alternative meds or how to get meds cheaper.    MR please advise.  Thanks!

## 2015-09-26 ENCOUNTER — Ambulatory Visit: Payer: Medicare Other | Admitting: Internal Medicine

## 2015-09-26 ENCOUNTER — Ambulatory Visit (INDEPENDENT_AMBULATORY_CARE_PROVIDER_SITE_OTHER): Payer: Medicare Other | Admitting: Internal Medicine

## 2015-09-26 VITALS — BP 122/60 | HR 83 | Ht 60.0 in | Wt 162.0 lb

## 2015-09-26 DIAGNOSIS — E539 Vitamin B deficiency, unspecified: Secondary | ICD-10-CM | POA: Diagnosis not present

## 2015-09-26 DIAGNOSIS — Z23 Encounter for immunization: Secondary | ICD-10-CM

## 2015-09-26 DIAGNOSIS — J449 Chronic obstructive pulmonary disease, unspecified: Secondary | ICD-10-CM

## 2015-09-26 DIAGNOSIS — J9611 Chronic respiratory failure with hypoxia: Secondary | ICD-10-CM

## 2015-09-26 MED ORDER — ARFORMOTEROL TARTRATE 15 MCG/2ML IN NEBU: 15.0000 ug | INHALATION_SOLUTION | Freq: Two times a day (BID) | RESPIRATORY_TRACT | Status: DC

## 2015-09-26 MED ORDER — BUDESONIDE 0.25 MG/2ML IN SUSP: RESPIRATORY_TRACT | Status: DC

## 2015-09-26 MED ORDER — LEVALBUTEROL TARTRATE 45 MCG/ACT IN AERO: 2.0000 | INHALATION_SPRAY | RESPIRATORY_TRACT | Status: DC | PRN

## 2015-09-26 NOTE — Progress Notes (Signed)
Subjective:     Patient ID: Bridget Fry, female   DOB: [birth year], 81 y.o.   MRN: [id number]  HPI    OV 09/26/2015  Chief Complaint  Patient presents with  . Follow-up    Pt states her breathing has worsned since last OV. pt states she is off hospice and can no longer pay for her medication. Pt c/o non prod cough, chest congestion.     Chronic hypoxemic respiratory failure on account of severe COPD he did she was on hospice care but she survived for many years on hospice. Therefore she is being discharged from hospice. She returns now for follow-up. She presents with a caretaker. COPD stable. She uses oxygen. She is very upset that hospice services were discontinued. She is upset that the healthcare system. Currently she needs to change over to nebulizers through her regular Medicare but she is finding them expensive. I spoke to her outpatient palliative care clinician. He is setting her up with [facility] program. Patient needs to be on Brovana and budesonide nebulizers twice a day. She will not do albuterol nebulizer because of tachycardia. She will not do Atrovent nebulizer due to unclear reasons. She will not do Xopenex nebulizer but she will go Xopenex HFA. She's not had a flu shot      Allergies  Allergen Reactions  . Albuterol Sulfate     Increased heart rate  . Codeine     REACTION: nausea  . Morphine And Related   . Tetracycline     REACTION: rash  . Xopenex [Levalbuterol]     Nebulizer caused nervousness, inhaler ok    Immunization History  Administered Date(s) Administered  . Influenza Split 09/01/2012, 09/12/2014  . Influenza Whole 08/20/2010, 08/14/2011  . Influenza,inj,Quad PF,36+ Mos 08/24/2013  . Pneumococcal Conjugate-13 02/20/2014  . Pneumococcal Polysaccharide-23 05/18/2010      Current outpatient prescriptions:  .  arformoterol (BROVANA) 15 MCG/2ML NEBU, Take 2 mLs (15 mcg total) by nebulization 2 (two) times daily., Disp: 120 mL, Rfl:  0 .  benzonatate (TESSALON) 200 MG capsule, TAKE ONE CAPSULE BY MOUTH THREE TIMES DAILY AS NEEDED. HOSPICE PATIENT, Disp: 90 capsule, Rfl: 0 .  budesonide (PULMICORT) 0.25 MG/2ML nebulizer solution, USE ONE VIAL IN NEBULIZER TWICE DAILY, Disp: 240 mL, Rfl: 3 .  cyanocobalamin (,VITAMIN B-12,) 1000 MCG/ML injection, INJECT 1 ML INTO THE SKIN ONCE A MONTH, Disp: 10 mL, Rfl: 0 .  dextromethorphan-guaiFENesin (MUCINEX DM) 30-600 MG per 12 hr tablet, Take 1 tablet by mouth 2 (two) times daily as needed. , Disp: , Rfl:  .  diazepam (VALIUM) 5 MG tablet, Take 1 tablet (5 mg total) by mouth at bedtime as needed for sedation., Disp: 30 tablet, Rfl: 2 .  ferrous sulfate 325 (65 FE) MG tablet, Take 325 mg by mouth 2 (two) times daily with a meal. , Disp: , Rfl:  .  FLUoxetine (PROZAC) 20 MG tablet, Take 20 mg by mouth 2 (two) times daily.  , Disp: , Rfl:  .  folic acid (FOLVITE) 1 MG tablet, Take 1 mg by mouth daily., Disp: , Rfl:  .  furosemide (LASIX) 40 MG tablet, One tablet daily (Patient taking differently: 1.5 mg. One tablet daily), Disp: , Rfl:  .  glipiZIDE (GLUCOTROL) 5 MG tablet, Take 5 mg by mouth 2 (two) times daily before a meal. , Disp: , Rfl:  .  HYDROcodone-homatropine (HYCODAN) 5-1.5 MG/5ML syrup, Take 5 mLs by mouth every 6 (six) hours as needed. HOSPICE PATIENT,  Disp: 120 mL, Rfl: 0 .  levalbuterol (XOPENEX HFA) 45 MCG/ACT inhaler, Inhale 2 puffs into the lungs as needed. HOSPICE PATIENT, Disp: 1 Inhaler, Rfl: 6 .  Loperamide HCl (IMODIUM PO), Take by mouth as needed., Disp: , Rfl:  .  meclizine (ANTIVERT) 12.5 MG tablet, Take 12.5 mg by mouth every 4 (four) hours as needed., Disp: , Rfl:  .  NON FORMULARY, Tumeric Tablets twice a day, Disp: , Rfl:  .  pantoprazole (PROTONIX) 40 MG tablet, Take 40 mg by mouth daily.  , Disp: , Rfl:  .  potassium chloride (KLOR-CON) 10 MEQ CR tablet, Take 10 mEq by mouth daily.  , Disp: , Rfl:  .  predniSONE (DELTASONE) 10 MG tablet, Take 0.5 tablets (5 mg  total) by mouth daily with breakfast.  every other day., Disp: 30 tablet, Rfl: 2 .  promethazine (PHENERGAN) 25 MG tablet, 25 mg every 8 (eight) hours as needed. , Disp: , Rfl:  .  sitaGLIPtin (JANUVIA) 50 MG tablet, Take 100 mg by mouth daily. , Disp: , Rfl:  .  tiotropium (SPIRIVA) 18 MCG inhalation capsule, Place 18 mcg into inhaler and inhale daily.  , Disp: , Rfl:     Review of Systems  ROS The following are not active complaints unless bolded  sore throat, dysphagia, dental problems, itching, sneezing, nasal congestion or excess/ purulent secretions, ear ache,  fever, chills, sweats, unintended wt loss, classically pleuritic or exertional cp, hemoptysis, orthopnea pnd or leg swelling, presyncope, palpitations, abdominal pain, anorexia, nausea, vomiting, diarrhea or change in bowel or bladder habits, change in stools or urine, dysuria,hematuria, rash, arthralgias, visual complaints, headache, numbness, weakness or ataxia or problems with walking or coordination, change in mood/affect or memory.       Objective:   Physical Exam  Constitutional: She is oriented to person, place, and time. She appears well-developed and well-nourished. No distress.  Obesity on wheelchair  HENT:  Head: Normocephalic and atraumatic.  Right Ear: External ear normal.  Left Ear: External ear normal.  Mouth/Throat: Oropharynx is clear and moist. No oropharyngeal exudate.  Oxygen on  Eyes: Conjunctivae and EOM are normal. Pupils are equal, round, and reactive to light. Right eye exhibits no discharge. Left eye exhibits no discharge. No scleral icterus.  Neck: Normal range of motion. Neck supple. No JVD present. No tracheal deviation present. No thyromegaly present.  Cardiovascular: Normal rate, regular rhythm, normal heart sounds and intact distal pulses.  Exam reveals no gallop and no friction rub.   No murmur heard. Pulmonary/Chest: Effort normal and breath sounds normal. No respiratory distress.  She has no wheezes. She has no rales. She exhibits no tenderness.  Purse lip breathing Class IV dyspnea No wheeze or crackles  Abdominal: Soft. Bowel sounds are normal. She exhibits no distension and no mass. There is no tenderness. There is no rebound and no guarding.  Abdomen distended  Musculoskeletal: Normal range of motion. She exhibits no edema or tenderness.  Varicose veins with 1+ chronic edema  Lymphadenopathy:    She has no cervical adenopathy.  Neurological: She is alert and oriented to person, place, and time. She has normal reflexes. No cranial nerve deficit. She exhibits normal muscle tone. Coordination normal.  Skin: Skin is warm and dry. No rash noted. She is not diaphoretic. No erythema. No pallor.  Psychiatric: She has a normal mood and affect. Her behavior is normal. Judgment and thought content normal.  Vitals reviewed.    Filed Vitals:   09/26/15 1612  BP: 122/60  Pulse: 83  Height: 5' (1.524 m)  Weight: 162 lb (73.483 kg)  SpO2: 97%        Assessment:       ICD-9-CM ICD-10-CM   1. Chronic respiratory failure with hypoxia (HCC) 518.83 J96.11    799.02    2. Chronic obstructive pulmonary disease, unspecified COPD type (HCC) 496 J44.9        Plan:      Disease stable Off hospice   Plan Try brovana and pulmicort twice daily through medicare part-B and DME company Use xopenex HFA as needed - through medicare part -B and DME company  Of note: DME company cannot get these set up for the next 2 days therefore we will given her a sample of STriverdi and Arunity too tight over the weekend and given instructions  If above expensie through MEdicare Part-B call us and we wil have to change things  Followup  4-06 weeks with me or my NP Tammy   > 50% of this > 25 min visit spent in face to face counseling or coordination of care     Dr. Kalman ShanMurali Taneka Espiritu, M.D., [facility].C.P Pulmonary and Critical Care Medicine Staff Physician [facility] [place]  Pulmonary and Critical Care Pager: [phone], If no answer or between  15:00h - 7:00h: call [phone]  09/26/2015 5:38 PM

## 2015-09-26 NOTE — Patient Instructions (Signed)
ICD-9-CM ICD-10-CM   1. Chronic respiratory failure with hypoxia (HCC) 518.83 J96.11    799.02    2. Chronic obstructive pulmonary disease, unspecified COPD type (HCC) 496 J44.9    Disease stable Off hospice   Plan Try brovana and pulmicort twice daily through medicare part-B Use xopenex HFA as needed - through medicare part -B  If above expensie through M[employer] Part-B call us and we wil have to change things  Followup  4-06 weeks with me or my NP Tammy

## 2015-09-29 ENCOUNTER — Other Ambulatory Visit: Payer: Self-pay | Admitting: *Deleted

## 2015-09-29 ENCOUNTER — Telehealth: Payer: Self-pay | Admitting: Internal Medicine

## 2015-09-29 DIAGNOSIS — J449 Chronic obstructive pulmonary disease, unspecified: Secondary | ICD-10-CM

## 2015-09-29 DIAGNOSIS — J9611 Chronic respiratory failure with hypoxia: Secondary | ICD-10-CM

## 2015-09-29 NOTE — Telephone Encounter (Signed)
Pt calling back about her meds she said someone helped her Friday for 2 hours [phone]

## 2015-09-29 NOTE — Patient Outreach (Signed)
I spoke with Mrs. Bridget Fry this afternoon and I explained that Bridget Fry from Hospice had referred her to me for care management services. I explained what [facility] provides and she is interested. I have scheduled a home visit for Friday afternoon.  Bridget Fry [facility]NP-BC [facility] Care Manager [phone]

## 2015-09-29 NOTE — Telephone Encounter (Signed)
Called spoke with pt. She reports [facility] told her that the brovana, budesonide and symbicort were all $285 each. She can't afford this. According to order in epic, pt [facility]  Called [facility] to check on this at [phone]. Spoke with Selena BattenKim. Confirmed they did receive the order for the neb medications. Pt has [employer] and is not contracted with them. They called pt and made her aware that she needed to try and get this from DME.  I have placed a new order per Almyra FreeLibby to try and get this through another DME. Please advise PCC's thanks

## 2015-09-29 NOTE — Telephone Encounter (Signed)
ATC pt x 3. Line busy. Will call back.

## 2015-09-30 MED ORDER — ARFORMOTEROL TARTRATE 15 MCG/2ML IN NEBU: 15.0000 ug | INHALATION_SOLUTION | Freq: Two times a day (BID) | RESPIRATORY_TRACT | Status: DC

## 2015-09-30 MED ORDER — BUDESONIDE 0.25 MG/2ML IN SUSP: RESPIRATORY_TRACT | Status: DC

## 2015-09-30 NOTE — Telephone Encounter (Signed)
Order faxed to [facility] Sally E Ottinger ° °

## 2015-09-30 NOTE — Telephone Encounter (Signed)
We will have to have new written rx to go to [facility]

## 2015-09-30 NOTE — Telephone Encounter (Signed)
New rx's done. [facility]. Please advise thanks

## 2015-10-03 ENCOUNTER — Telehealth: Payer: Self-pay | Admitting: Pulmonary Disease

## 2015-10-03 ENCOUNTER — Other Ambulatory Visit: Payer: Self-pay | Admitting: *Deleted

## 2015-10-03 ENCOUNTER — Encounter: Payer: Self-pay | Admitting: *Deleted

## 2015-10-03 VITALS — BP 100/60 | HR 106 | Wt 163.0 lb

## 2015-10-03 DIAGNOSIS — J449 Chronic obstructive pulmonary disease, unspecified: Secondary | ICD-10-CM

## 2015-10-03 NOTE — Telephone Encounter (Signed)
[facility]

## 2015-10-03 NOTE — Patient Outreach (Signed)
[facility]) Care Management   10/03/2015  Bridget DredgeVictoria Fry Sep 08, 1931 [id number]  Bridget DredgeVictoria Fry is an 81 y.o. female  Subjective: New pt referred to me from [facility], Palliative care NP, as pt was discharged from Hospice and needs support. Pt does not have any family here for support. She is paying private care CNAs to assist her. She cannot afford her COPD meds. She complains of dizziness, HAs, Hx falls, nose bleeds, rectal bleeding. She cannot bathe herself well independently, She does not drive. The hired ladies have to do all of these errands and her housekeeping.   Objective:   Review of Systems  HENT: Positive for nosebleeds.   Eyes: Positive for blurred vision.  Respiratory: Positive for cough, sputum production and shortness of breath.   Cardiovascular: Positive for chest pain.  Gastrointestinal: Positive for nausea, diarrhea, constipation and blood in stool.  Musculoskeletal: Positive for back pain.  Skin: Negative.   Neurological: Positive for dizziness, weakness and headaches.  Endo/Heme/Allergies: Bruises/bleeds easily.  Psychiatric/Behavioral: Positive for depression. The patient has insomnia.    BP 100/60 mmHg  Pulse 106  Wt 163 lb (73.936 kg)  SpO2 96%   Physical Exam  Constitutional: She is oriented to person, place, and time. She appears well-developed and well-nourished.  HENT:  Head: Normocephalic.  Cardiovascular: Normal rate, regular rhythm and normal heart sounds.   Respiratory: She exhibits tenderness.  Breath sounds are diminished.  GI: Soft. Bowel sounds are normal.  Musculoskeletal: She exhibits edema.  Limited ROM. 3+ edema in lower extremities.  Neurological: She is alert and oriented to person, place, and time.  Skin: Skin is warm and dry.  Psychiatric:  Depressed!    Current Medications:   Current Outpatient Prescriptions  Medication Sig Dispense Refill  . arformoterol (BROVANA) 15 MCG/2ML NEBU Take 2 mLs (15 mcg total)  by nebulization 2 (two) times daily. 240 mL 11  . benzonatate (TESSALON) 200 MG capsule TAKE ONE CAPSULE BY MOUTH THREE TIMES DAILY AS NEEDED. HOSPICE PATIENT 90 capsule 0  . budesonide (PULMICORT) 0.25 MG/2ML nebulizer solution USE ONE VIAL IN NEBULIZER TWICE DAILY 240 mL 11  . cyanocobalamin (,VITAMIN B-12,) 1000 MCG/ML injection INJECT 1 ML INTO THE SKIN ONCE A MONTH 10 mL 0  . dextromethorphan-guaiFENesin (MUCINEX DM) 30-600 MG per 12 hr tablet Take 1 tablet by mouth 2 (two) times daily as needed.     . diazepam (VALIUM) 5 MG tablet Take 1 tablet (5 mg total) by mouth at bedtime as needed for sedation. 30 tablet 2  . ferrous sulfate 325 (65 FE) MG tablet Take 325 mg by mouth 2 (two) times daily with a meal.     . FLUoxetine (PROZAC) 20 MG tablet Take 20 mg by mouth 2 (two) times daily.      . folic acid (FOLVITE) 1 MG tablet Take 1 mg by mouth daily.    . furosemide (LASIX) 40 MG tablet One tablet daily (Patient taking differently: 1.5 mg. One tablet daily)    . glipiZIDE (GLUCOTROL) 5 MG tablet Take 5 mg by mouth 2 (two) times daily before a meal.     . HYDROcodone-homatropine (HYCODAN) 5-1.5 MG/5ML syrup Take 5 mLs by mouth every 6 (six) hours as needed. HOSPICE PATIENT 120 mL 0  . levalbuterol (XOPENEX HFA) 45 MCG/ACT inhaler Inhale 2 puffs into the lungs as needed. Through Medicare part B 1 Inhaler 6  . Loperamide HCl (IMODIUM PO) Take by mouth as needed.    . meclizine (ANTIVERT)  12.5 MG tablet Take 12.5 mg by mouth every 4 (four) hours as needed.    . NON FORMULARY Tumeric Tablets twice a day    . pantoprazole (PROTONIX) 40 MG tablet Take 40 mg by mouth daily.      . potassium chloride (KLOR-CON) 10 MEQ CR tablet Take 10 mEq by mouth daily.      . predniSONE (DELTASONE) 10 MG tablet Take 0.5 tablets (5 mg total) by mouth daily with breakfast.  every other day. 30 tablet 2  . promethazine (PHENERGAN) 25 MG tablet 25 mg every 8 (eight) hours as needed.     . sitaGLIPtin (JANUVIA) 50 MG  tablet Take 100 mg by mouth daily.     Marland Kitchen tiotropium (SPIRIVA) 18 MCG inhalation capsule Place 18 mcg into inhaler and inhale daily.       No current facility-administered medications for this visit.    Functional Status:   In your present state of health, do you have any difficulty performing the following activities: 10/03/2015  Hearing? N  Vision? N  Difficulty concentrating or making decisions? N  Walking or climbing stairs? Y  Dressing or bathing? Y  Doing errands, shopping? Y  Preparing Food and eating ? Y  Using the Toilet? N  In the past six months, have you accidently leaked urine? Y  Do you have problems with loss of bowel control? N  Managing your Medications? Y  Managing your Finances? Y  Housekeeping or managing your Housekeeping? Y    Fall/Depression Screening:    PHQ 2/9 Scores 10/03/2015  PHQ - 2 Score 4  PHQ- 9 Score 14   Fall Risk  10/03/2015  Falls in the past year? Yes  Number falls in past yr: 2 or more  Injury with Fall? No  Risk for fall due to : History of fall(s);Impaired balance/gait;Impaired mobility;Medication side effect  Follow up Falls evaluation completed   Assessment:  COPD - discharged from Hospice   Financial challenges   No support system (no family locally)   Needs podiatry services.  Plan: Filled med box.  Will refer for pharmacy assistance.  Will refer for social services: CHRP application.  I will provide foot care next week, when I see her.  [facility] CM Care Plan Problem One        Most Recent Value   Care Plan Problem One  COPD   Role Documenting the Problem One  Care Management Coordinator   Care Plan for Problem One  Active   [facility] Long Term Goal (31-90 days)  Pt will not admit to the hospital in the next 90 days.   [facility] Long Term Goal Start Date  10/03/15   Interventions for Problem One Long Term Goal  Advised pt that if she has any COPD exacerbation sxs to please call me for intervention.    [facility] CM Care Plan Problem Two         Most Recent Value   Care Plan Problem Two  Pt is unable to pay for her COPD medicatioins.   Role Documenting the Problem Two  Care Management Coordinator   Care Plan for Problem Two  Active   [facility] CM Short Term Goal #1 (0-30 days)  Pt will complete any information form for pharmacy assistance to obtain her necessary medications.   [facility] CM Short Term Goal #1 Start Date  10/03/15   Interventions for Short Term Goal #2   Refer to [facility] pharmacist for evaluation for assistance.    [facility]  CM Care Plan Problem Three        Most Recent Value   Care Plan Problem Three  No regular support system and needs assistance with ADLs and IADLs.   Role Documenting the Problem Three  Care Management Coordinator   Care Plan for Problem Three  Active   [facility] CM Short Term Goal #1 (0-30 days)  Pt will complete application for CHRP program.   [facility] CM Short Term Goal #1 Start Date  10/03/15   Interventions for Short Term Goal #1  Refer to CSW to assist with [facility] application.     [name] [facility] Care Manager [phone]

## 2015-10-06 NOTE — Telephone Encounter (Signed)
Is that through DME company Part B MEdicare?

## 2015-10-06 NOTE — Telephone Encounter (Signed)
Please check with DME how much it will cost for  A) Duoneb 4 times daily   B) Xopenex neb 4 times daily + ATrovent neb 4 times daily   Based on above I will talk to patient  THanks  Dr. Kalman ShanMurali Rajon Bisig, M.D., [facility]C.C.P Pulmonary and Critical Care Medicine Staff Physician [facility] [place] [facility] Pager: [phone], If no answer or between  15:00h - 7:00h: call [phone]  10/06/2015 5:38 PM

## 2015-10-06 NOTE — Telephone Encounter (Signed)
Yes this is through DME

## 2015-10-06 NOTE — Telephone Encounter (Signed)
Patient is now calling about nebulizer and medication she received last Wednesday.  She wants to know who ordered this medication and how much it will be.  She can be reached at [phone].

## 2015-10-06 NOTE — Telephone Encounter (Signed)
The nebulizers are $100/30 day supply each.  Pt cannot afford the $200/month for 2 nebulized meds only.

## 2015-10-06 NOTE — Telephone Encounter (Signed)
Called [facility] and received VM and LMTCB x2

## 2015-10-06 NOTE — Telephone Encounter (Signed)
lmtcb X1 for [facility] (Child psychotherapistsocial worker at hospice)

## 2015-10-06 NOTE — Telephone Encounter (Signed)
She does not need Symbicort or Spiriva any inhaler while she is taking the nebulizer of Brovana and  budesonide scheduled with Xopenex HFA as needed. Just the nebulizers alone is there still a cost problem?

## 2015-10-06 NOTE — Telephone Encounter (Signed)
Once she got out of hospice she ran out of all her nebulizers. She saw me on a Friday afternoon. We could not get her nebulizers through Medicare part B and time. That's why we gave those inhalers just a tight over till she got nebulizers.  My recommendation  brovana and pulmicort twice daily through medicare part-B Use xopenex HFA as needed - through medicare part -B  Has she gotten these nebulizers yet >?  In any event she might be having a little bit of an exacerbation right now. So  Plan do short course prednisone  Take prednisone 40 mg daily x 2 days, then 20mg  daily x 2 days, then 10mg  daily x 2 days, then 5mg  daily x baseline to continue

## 2015-10-06 NOTE — Telephone Encounter (Signed)
[facility] states that since pt started arnuity, striverdi, and spiriva respimat pt has been experiencing dizziness and "straining" for breath.  S/s present X1 week. [facility] and pt are requesting recs.    MR please advise.  Thanks!

## 2015-10-06 NOTE — Telephone Encounter (Signed)
Spoke with pt. She reports she received pulmicort med in the mail but has not opened yet bc she has not been told how much this will cost her. She reports she has called DME and was given run around.  I called [facility] and spoke with Larita FifeLynn. Was advised they have spoken with pt on multiple occasions and advised her the pulmicort and the brovana will cost her about $100 each per medication so that means $200/month. Also lynn states pt should have both neb medications as they both were shipped to pt on 10/18 together. I advised her pt is stating she only received 1 neb medication but will clarify with pt. Called pt and line rings busy x 3 wcb

## 2015-10-06 NOTE — Telephone Encounter (Signed)
Called spoke with pt. She looked into the box and she did receive brovana and budesonide for nebulizer. She reports she can't afford $200 per month for both of these and also take symbicort/spiriva. Pt wants to know what MR wants her to do bc she can't pay this Please advise MR thanks

## 2015-10-07 NOTE — Telephone Encounter (Signed)
Let patient know that   A) she can do duoneb 4 times daily - it ihas albuterol which she does not like. And she can pay < $15/month + we can help with scrip for an ICS like flovent which should be afordable.   B) if she abscolutely does not want albtuerol she can do atrovent neb 4 times daily and hope for best + cheap ICS   That is really her only choice because of cost

## 2015-10-07 NOTE — Telephone Encounter (Signed)
Per kim with [facility] the duoneb has a copay of $12-$13. As stated below xopenex is not covered by medicare.  Please advise thanks

## 2015-10-07 NOTE — Telephone Encounter (Signed)
lmtcb x1 for pt. Need to verify DME.

## 2015-10-07 NOTE — Telephone Encounter (Signed)
Called spoke with Selena BattenKim from [facility]. She is going to check on the price of duoneb and call us Medicare does not cover xopenex nebs Will await call back

## 2015-10-07 NOTE — Telephone Encounter (Signed)
lmtcb X1 for pt  

## 2015-10-08 NOTE — Telephone Encounter (Signed)
Josh PA  from [facility] [facility] [employer] calling stating that pt still having concerns about med and wants us to give her a call.Bridget GriffinsStanley A Fry

## 2015-10-08 NOTE — Telephone Encounter (Signed)
lmtcb x1 for pt. 

## 2015-10-08 NOTE — Telephone Encounter (Signed)
If it's to schedule appt just call the pt but if need to speak to [facility] call [phone]

## 2015-10-08 NOTE — Telephone Encounter (Signed)
lmtcb x1 for [facility].

## 2015-10-09 NOTE — Telephone Encounter (Signed)
Called pt home # and line rings busy x 3 WCB

## 2015-10-09 NOTE — Patient Outreach (Signed)
[facility]) Care Management  09/29/2015  Trixie DredgeVictoria Reason February 07, 1931 [id number]   Notification from [name], NP she received referral from Hospice.  Thanks, Corrie MckusickLisa O. Sharlee BlewMoore, AABA [facility] Care Management [facility] CM Assistant Phone: [phone] Fax: [phone]

## 2015-10-10 ENCOUNTER — Other Ambulatory Visit: Payer: Medicare Other | Admitting: *Deleted

## 2015-10-10 ENCOUNTER — Other Ambulatory Visit: Payer: Self-pay | Admitting: Pharmacist

## 2015-10-10 NOTE — Patient Outreach (Signed)
[facility]) Care Management  10/10/2015  Bridget DredgeVictoria Fry 1931-04-25 [id number]   Request from [name], NP to assign Pharmacy and SW, assigned Steve Rattlerawn Pettus, PharmD and [name] Joyce, Kentucky[place]CSW.  Thanks, Corrie MckusickLisa O. Sharlee BlewMoore, AABA [facility] Care Management [facility] CM Assistant Phone: [phone] Fax: [phone]

## 2015-10-10 NOTE — Patient Outreach (Signed)
[facility]) Care Management  [facility]   10/10/2015  Bridget Fry 23-Aug-1931 [id number]  Subjective: Bridget Fry is a 81 y.o. female who was referred to [facility] CM Pharmacy for medication assistance.  Patient is having difficulty affording his medications, particularly the COPD medications.   Patient is on Brovana (arformoterol), Pulmicort (budesonide), Xopenex (levalbuterol), and Spiriva (tiotropium).   Both the Brovana and the Pulmicort are medications given through the nebulizer.   Objective:   Current Medications: Current Outpatient Prescriptions  Medication Sig Dispense Refill  . arformoterol (BROVANA) 15 MCG/2ML NEBU Take 2 mLs (15 mcg total) by nebulization 2 (two) times daily. 240 mL 11  . benzonatate (TESSALON) 200 MG capsule TAKE ONE CAPSULE BY MOUTH THREE TIMES DAILY AS NEEDED. HOSPICE PATIENT 90 capsule 0  . budesonide (PULMICORT) 0.25 MG/2ML nebulizer solution USE ONE VIAL IN NEBULIZER TWICE DAILY 240 mL 11  . cyanocobalamin (,VITAMIN B-12,) 1000 MCG/ML injection INJECT 1 ML INTO THE SKIN ONCE A MONTH 10 mL 0  . dextromethorphan-guaiFENesin (MUCINEX DM) 30-600 MG per 12 hr tablet Take 1 tablet by mouth 2 (two) times daily as needed.     . diazepam (VALIUM) 5 MG tablet Take 1 tablet (5 mg total) by mouth at bedtime as needed for sedation. 30 tablet 2  . ferrous sulfate 325 (65 FE) MG tablet Take 325 mg by mouth 2 (two) times daily with a meal.     . FLUoxetine (PROZAC) 20 MG tablet Take 20 mg by mouth 2 (two) times daily.      . folic acid (FOLVITE) 1 MG tablet Take 1 mg by mouth daily.    . furosemide (LASIX) 40 MG tablet One tablet daily (Patient taking differently: 1.5 mg. One tablet daily)    . glipiZIDE (GLUCOTROL) 5 MG tablet Take 5 mg by mouth 2 (two) times daily before a meal.     . HYDROcodone-homatropine (HYCODAN) 5-1.5 MG/5ML syrup Take 5 mLs by mouth every 6 (six) hours as needed. HOSPICE PATIENT 120 mL 0  . levalbuterol (XOPENEX HFA)  45 MCG/ACT inhaler Inhale 2 puffs into the lungs as needed. Through Medicare part B 1 Inhaler 6  . Loperamide HCl (IMODIUM PO) Take by mouth as needed.    . meclizine (ANTIVERT) 12.5 MG tablet Take 12.5 mg by mouth every 4 (four) hours as needed.    . NON FORMULARY Tumeric Tablets twice a day    . pantoprazole (PROTONIX) 40 MG tablet Take 40 mg by mouth daily.      . potassium chloride (KLOR-CON) 10 MEQ CR tablet Take 10 mEq by mouth daily.      . predniSONE (DELTASONE) 10 MG tablet Take 0.5 tablets (5 mg total) by mouth daily with breakfast.  every other day. 30 tablet 2  . promethazine (PHENERGAN) 25 MG tablet 25 mg every 8 (eight) hours as needed.     . sitaGLIPtin (JANUVIA) 50 MG tablet Take 100 mg by mouth daily.     Marland Kitchen tiotropium (SPIRIVA) 18 MCG inhalation capsule Place 18 mcg into inhaler and inhale daily.       No current facility-administered medications for this visit.    Functional Status: In your present state of health, do you have any difficulty performing the following activities: 10/03/2015  Hearing? N  Vision? N  Difficulty concentrating or making decisions? N  Walking or climbing stairs? Y  Dressing or bathing? Y  Doing errands, shopping? Y  Preparing Food and eating ? Y  Using the  Toilet? N  In the past six months, have you accidently leaked urine? Y  Do you have problems with loss of bowel control? N  Managing your Medications? Y  Managing your Finances? Y  Housekeeping or managing your Housekeeping? Y    Fall/Depression Screening: PHQ 2/9 Scores 10/03/2015  PHQ - 2 Score 4  PHQ- 9 Score 14    Assessment: 1. Medication assistance: patient on Brovana and Pulmicort which since they are used with a nebulizer are covered by Medicare Part B. Patient would have to present her Medicare Red White and Blue card to get it covered and use a pharmacy that bills part B. She uses [facility] which should be able to bill part B plans. Xopenex and Spiriva are covered under  Medicare Part D.   Plan: 1. Medication assistance: I called the patient to discuss medication assistance and I was unable to leave a message due to a busy line. I attempted multiple times with no success. I will refer back to Steve Rattlerawn Pettus, PharmD, to contact patient next week.    [name], PharmD, BCPS Clinical Pharmacist [facility] [phone]

## 2015-10-10 NOTE — Telephone Encounter (Signed)
lmomtcb x1 for pt 

## 2015-10-10 NOTE — Addendum Note (Signed)
Addended by: Almetta LovelySPINKS, Chidinma Clites on: 10/10/2015 09:13 AM   Modules accepted: Orders

## 2015-10-12 NOTE — Patient Outreach (Signed)
[facility]) Care Management      Bridget DredgeVictoria Fry Dec 12, 1931 [id number]  10/10/15  I called pt today to remind her of our appt. Later in the day. Pt reported that she had a HA and was in bed. She said she didn't feel well enough for a visit. I offered that I may be able to assist her even so but she still declinced the house call. I asked if I could call later to check up on her.  5:00 pm Phone was busy.  6:00 pm phone was busy.  I will call pt on Monday to reschedule our visit.   Almetta LovelyCarroll [name] Park [facility] Surgical Center LtdGNP-BC [facility] Care Manager [phone]

## 2015-10-13 ENCOUNTER — Other Ambulatory Visit: Payer: Self-pay

## 2015-10-13 NOTE — Telephone Encounter (Signed)
lmtcb x3 for pt. 

## 2015-10-13 NOTE — Patient Outreach (Signed)
Ms. Bridget Fry is an 81 year old female who was referred to pharmacy due to not being able to afford her inhalers and nebulizing solutions.  She is currently on Budesonide, Brovana, Symbicort and Xopenex.  Per [facility] a 30 day supply of all these would be around $700.  The Budesonide and Brovana are nebulizing solutions which are covered through Medicare Part B.  Budesonide is $125.08 and Brovana is $353.51.  Per Ms. Bridget Fry, her income is too much to qualify for Extra Help.  There are patient assistance program available for all four medications.  Unfortunately, it is unclear if she will qualify.  I will refer her to Lesle Reekamita Rhodie, care management assistant to work on the patient assistance program applications.  I will continue to follow her as well.   Steve Rattlerawn Claudene Gatliff, PharmD, [employer]BCACP [facility] Pharmacy Manager [phone]

## 2015-10-15 ENCOUNTER — Other Ambulatory Visit: Payer: Self-pay | Admitting: Licensed Clinical Social Worker

## 2015-10-15 NOTE — Patient Outreach (Signed)
[facility]) Care Management  10/15/2015  Bridget DredgeVictoria Fry Mar 10, 1931 [id number]   Assessment-CSW contacted patient on 10/15/15 after receiving referral to assist patient with gaining information on the [facility] and eligibility. CSW introduced self, reason for call and of [facility] social work services. Patient provided HIPPA verifications but asked if CSW could contact her on a different day as she is not feeling well. CSW contacted patient back on 10/16/15 and patient stated that she had just taken her medications and asked if she could contact CSW back later.   Plan-CSW will await call back from patient.  [name] Lindsey Hommel, BSW, MSW, LCSW [facility] Care Management [email or web address] Phone: [phone] Fax: [phone]

## 2015-10-17 NOTE — Patient Outreach (Signed)
[facility]) Care Management  10/17/2015  Trixie DredgeVictoria Gravatt 1931-03-27 [id number]   Telephone outreach to patient to assist in completing patient assistance applications. I was unable to reach patient and no voicemail picked up for me to leave a HIPPA compliant message. I will try reaching the patient again next week.  Vola Beneke L. Ceniya Fowers, AAS [facility] Care Management Assistant

## 2015-10-20 ENCOUNTER — Other Ambulatory Visit: Payer: Self-pay

## 2015-10-20 NOTE — Patient Outreach (Signed)
I called Ms. Bridget Fry to discuss what we were able to find out about her medications.  Brovana and Xopenex do not have patient assistance programs.  I also told her that [name] was trying to reach in regards to the Symbicort and Budesonide.  She stated she would look out for the call.  I will follow up in a few weeks to see if she was able to get the medications.   Steve Rattlerawn Philana Younis, PharmD, [employer]BCACP [facility] Pharmacy Manager [phone]

## 2015-10-21 ENCOUNTER — Other Ambulatory Visit: Payer: Self-pay | Admitting: Licensed Clinical Social Worker

## 2015-10-21 NOTE — Patient Outreach (Signed)
[facility]) Care Management  10/21/2015  Trixie DredgeVictoria Couillard [birth year] [id number]   Assessment-CSW completed outreach to patient on 10/21/15. Patient answered, provided HIPPA verifications and expressed that she was unable to talk to today as she is not feeling well. CSW informed patient that she would contact her tomorrow to see if she felt any better to talk.  Plan-CSW will complete another outreach on 10/22/15.  [name] Jin Capote, BSW, MSW, LCSW [facility] Care Management [email or web address] Phone: [phone] Fax: [phone]

## 2015-10-22 ENCOUNTER — Other Ambulatory Visit: Payer: Self-pay | Admitting: Licensed Clinical Social Worker

## 2015-10-22 NOTE — Patient Outreach (Signed)
[facility]) Care Management  10/22/2015  Bridget DredgeVictoria Fry 1931/03/16 [id number]   Assessment-CSW completed outreach attempt but was unsuccessful in reaching patient. CSW unable to leave HIPPA compliant voice message as phone continuously rang. CSW is having a difficult time maintaining contact with patient.  Plan-CSW will make another outreach by 10/24/15.  Bridget Fry Bridget Fry, BSW, MSW, LCSW [facility] Care Management [email or web address] Phone: [phone] Fax: [phone]

## 2015-10-23 ENCOUNTER — Ambulatory Visit: Payer: Medicare Other | Admitting: Adult Health

## 2015-10-23 ENCOUNTER — Other Ambulatory Visit: Payer: Self-pay | Admitting: *Deleted

## 2015-10-23 NOTE — Patient Outreach (Signed)
Telephone call: I have called pt numerous times to advise I am coming for a home visit and she had asked me to come another time. Today, she says she doesn't want to be bothered and she doesn't remember the benefit of our program.  I explained that I can be an excellent support for her. I can see her if she feels like she needs to be seen by a provider and possibly avoid an MD visit (difficult for pt to go out of the home in her condition). She asked if I could give her B12 injection and I told her I could.  I expressed my concern for her frailty. I told her I don't want to impose but I feel like you really need me especially since she has been discharged from Hospice.   She told me I could come in December and give her her B12. I told her I would order the medicine and she could have her nurse aid pick it up at her convenience.  I have scheduled the appt for Thursday Dec 8th at 12:30 pm.  Almetta LovelyCarroll Elroy Schembri [facility] SurgeryGNP-BC [facility] Care Manager [phone]

## 2015-10-24 ENCOUNTER — Encounter: Payer: Self-pay | Admitting: Adult Health

## 2015-10-24 ENCOUNTER — Telehealth: Payer: Self-pay | Admitting: Internal Medicine

## 2015-10-24 ENCOUNTER — Ambulatory Visit (INDEPENDENT_AMBULATORY_CARE_PROVIDER_SITE_OTHER): Payer: Medicare Other | Admitting: Adult Health

## 2015-10-24 VITALS — BP 136/78 | HR 86 | Temp 98.2°F | Ht 61.0 in | Wt 162.0 lb

## 2015-10-24 DIAGNOSIS — E539 Vitamin B deficiency, unspecified: Secondary | ICD-10-CM | POA: Diagnosis not present

## 2015-10-24 DIAGNOSIS — J9611 Chronic respiratory failure with hypoxia: Secondary | ICD-10-CM

## 2015-10-24 DIAGNOSIS — J449 Chronic obstructive pulmonary disease, unspecified: Secondary | ICD-10-CM

## 2015-10-24 MED ORDER — TIOTROPIUM BROMIDE MONOHYDRATE 18 MCG IN CAPS: 18.0000 ug | ORAL_CAPSULE | Freq: Every day | RESPIRATORY_TRACT | Status: DC

## 2015-10-24 MED ORDER — ARFORMOTEROL TARTRATE 15 MCG/2ML IN NEBU: 15.0000 ug | INHALATION_SOLUTION | Freq: Two times a day (BID) | RESPIRATORY_TRACT | Status: AC

## 2015-10-24 MED ORDER — LEVALBUTEROL TARTRATE 45 MCG/ACT IN AERO: 2.0000 | INHALATION_SPRAY | RESPIRATORY_TRACT | Status: DC | PRN

## 2015-10-24 MED ORDER — BUDESONIDE 0.25 MG/2ML IN SUSP: RESPIRATORY_TRACT | Status: DC

## 2015-10-24 NOTE — Telephone Encounter (Signed)
This message can be closed.  Tammy Parrett signed this for the patient.  I will mail to patient as requested.

## 2015-10-24 NOTE — Patient Instructions (Signed)
Continue on current regimen .  follow up Dr. Marchelle Gearingamaswamy in 3 months and As needed

## 2015-10-24 NOTE — Addendum Note (Signed)
Addended by: Karalee HeightOX, Theodis Kinsel P on: 10/24/2015 04:49 PM   Modules accepted: Orders

## 2015-10-24 NOTE — Assessment & Plan Note (Signed)
Cont on O2 .  

## 2015-10-24 NOTE — Progress Notes (Signed)
Subjective:    Patient ID: Bridget Fry, female    DOB: 1931/01/29, 81 y.o.   MRN: [id number]  HPI  81 yo female with COPD and chronic resp failure   10/24/2015 Follow up :  Pt returns for 1 month follow up .  She has moderate to severe COPD on O2 .  Remains on 3l/m of O2.  Remains on Brovana and Pulmicort neb . Remains on Spiriva .  Breathing is doing about the same. Gets  SOB with activity.  Has clear to white  chest congestion, nausea, and sinus drainage. Denies cough, fever or vomiting. Flu , PVX and Prevnar utd.  Handicap papers completed.    Past Medical History  Diagnosis Date  . Other B-complex deficiencies   . Thrombocytopenia, unspecified (HCC)   . Esophageal reflux   . Unspecified essential hypertension   . Depressive disorder, not elsewhere classified   . Anxiety state, unspecified   . Chronic airway obstruction, not elsewhere classified    Current Outpatient Prescriptions on File Prior to Visit  Medication Sig Dispense Refill  . arformoterol (BROVANA) 15 MCG/2ML NEBU Take 2 mLs (15 mcg total) by nebulization 2 (two) times daily. 240 mL 11  . benzonatate (TESSALON) 200 MG capsule TAKE ONE CAPSULE BY MOUTH THREE TIMES DAILY AS NEEDED. HOSPICE PATIENT 90 capsule 0  . budesonide (PULMICORT) 0.25 MG/2ML nebulizer solution USE ONE VIAL IN NEBULIZER TWICE DAILY 240 mL 11  . cyanocobalamin (,VITAMIN B-12,) 1000 MCG/ML injection INJECT 1 ML INTO THE SKIN ONCE A MONTH 10 mL 0  . dextromethorphan-guaiFENesin (MUCINEX DM) 30-600 MG per 12 hr tablet Take 1 tablet by mouth 2 (two) times daily as needed.     . diazepam (VALIUM) 5 MG tablet Take 1 tablet (5 mg total) by mouth at bedtime as needed for sedation. 30 tablet 2  . ferrous sulfate 325 (65 FE) MG tablet Take 325 mg by mouth 2 (two) times daily with a meal.     . FLUoxetine (PROZAC) 20 MG tablet Take 20 mg by mouth 2 (two) times daily.      . folic acid (FOLVITE) 1 MG tablet Take 1 mg by mouth daily.    . furosemide  (LASIX) 40 MG tablet One tablet daily (Patient taking differently: 1.5 mg. One tablet daily)    . glipiZIDE (GLUCOTROL) 5 MG tablet Take 5 mg by mouth 2 (two) times daily before a meal.     . HYDROcodone-homatropine (HYCODAN) 5-1.5 MG/5ML syrup Take 5 mLs by mouth every 6 (six) hours as needed. HOSPICE PATIENT 120 mL 0  . levalbuterol (XOPENEX HFA) 45 MCG/ACT inhaler Inhale 2 puffs into the lungs as needed. Through Medicare part B 1 Inhaler 6  . Loperamide HCl (IMODIUM PO) Take by mouth as needed.    . meclizine (ANTIVERT) 12.5 MG tablet Take 12.5 mg by mouth every 4 (four) hours as needed.    . NON FORMULARY Tumeric Tablets twice a day    . pantoprazole (PROTONIX) 40 MG tablet Take 40 mg by mouth daily.      . potassium chloride (KLOR-CON) 10 MEQ CR tablet Take 10 mEq by mouth daily.      . predniSONE (DELTASONE) 10 MG tablet Take 0.5 tablets (5 mg total) by mouth daily with breakfast. 5mg  every other day. 30 tablet 2  . promethazine (PHENERGAN) 25 MG tablet 25 mg every 8 (eight) hours as needed.     . tiotropium (SPIRIVA) 18 MCG inhalation capsule Place 18 mcg into  inhaler and inhale daily.      . sitaGLIPtin (JANUVIA) 50 MG tablet Take 100 mg by mouth daily.      No current facility-administered medications on file prior to visit.     Review of Systems Constitutional:   No  weight loss, night sweats,  Fevers, chills, + fatigue, or  lassitude.  HEENT:   No headaches,  Difficulty swallowing,  Tooth/dental problems, or  Sore throat,                No sneezing, itching, ear ache, nasal congestion, post nasal drip,   CV:  No chest pain,  Orthopnea, PND,  , anasarca, dizziness, palpitations, syncope.   GI  No heartburn, indigestion, abdominal pain, nausea, vomiting, diarrhea, change in bowel habits, loss of appetite, bloody stools.   Resp:  .  No chest wall deformity  Skin: no rash or lesions.  GU: no dysuria, change in color of urine, no urgency or frequency.  No flank pain, no  hematuria   MS:  No joint pain or swelling.  No decreased range of motion.  No back pain.  Psych:  No change in mood or affect. No depression or anxiety.  No memory loss.         Objective:   Physical Exam GEN: A/Ox3; pleasant , NAD, chronically ill appearing in wc on O2   HEENT:  [place]/AT,  EACs-clear, TMs-wnl, NOSE-clear, THROAT-clear, no lesions, no postnasal drip or exudate noted.   NECK:  Supple w/ fair ROM; no JVD; normal carotid impulses w/o bruits; no thyromegaly or nodules palpated; no lymphadenopathy.  RESP  Decreased BS in bases , no accessory muscle use, no dullness to percussion  CARD:  RRR, GR 2 SM  , tr peripheral edema, pulses intact, no cyanosis or clubbing.  GI:   Soft & nt; nml bowel sounds; no organomegaly or masses detected.  Musco: Warm bil, no deformities or joint swelling noted.   Neuro: alert, no focal deficits noted.    Skin: Warm, no lesions or rashes         Assessment & Plan:

## 2015-10-24 NOTE — Telephone Encounter (Signed)
Attempted to contact patient to see if she wanted form to be mailed or if she wants to pick up. No answer Will call back

## 2015-10-24 NOTE — Assessment & Plan Note (Signed)
Stable on current regimen   Plan  Continue on current regimen .  follow up Dr. Marchelle Gearingamaswamy in 3 months and As needed

## 2015-10-27 ENCOUNTER — Telehealth: Payer: Self-pay | Admitting: Adult Health

## 2015-10-27 DIAGNOSIS — E539 Vitamin B deficiency, unspecified: Secondary | ICD-10-CM

## 2015-10-27 MED ORDER — LEVALBUTEROL TARTRATE 45 MCG/ACT IN AERO: 2.0000 | INHALATION_SPRAY | RESPIRATORY_TRACT | Status: AC | PRN

## 2015-10-27 NOTE — Telephone Encounter (Signed)
Patient calling to have Xopenex HFA refilled as DAW.  Patient says that she cannot use generic xopenex.  Rx sent to pharmacy as DAW. Patient notified. Nothing further needed. Closing encounter

## 2015-10-28 ENCOUNTER — Telehealth: Payer: Self-pay | Admitting: Adult Health

## 2015-10-28 NOTE — Telephone Encounter (Signed)
Spoke with pt, states the refills sent to her pharmacy on Friday aren't covered by her insurance.   [facility] [address]    Called Pharmacy, xopenex needs PA.  Initiation form is being faxed to our office, verified fax #.  Will await fax.  Spoke with pt, aware that a PA needs to be done for her rx to be covered by insurance.  Advised that we will update her once we hear from her insurance company.

## 2015-10-29 ENCOUNTER — Other Ambulatory Visit: Payer: Self-pay | Admitting: Licensed Clinical Social Worker

## 2015-10-29 NOTE — Telephone Encounter (Signed)
[facility]: [id number] Sent for review. Will await response.

## 2015-10-29 NOTE — Patient Outreach (Signed)
[facility]) Care Management  10/29/2015  Trixie DredgeVictoria Sassone 05/24/1931 [id number]   Assessment-CSW completed outreach to patient's residence on 10/29/15 and was successful in reaching patient. Patient reports that it is too early to discuss anything and she would like for CSW to try again tomorrow afternoon.   Plan-CSW will make outreach tomorrow on 10/30/15 to patient after 12 pm.  [name] Loribeth Katich, BSW, MSW, LCSW [facility] Care Management [email or web address] Phone: [phone] Fax: [phone]

## 2015-10-30 ENCOUNTER — Other Ambulatory Visit: Payer: Self-pay | Admitting: Licensed Clinical Social Worker

## 2015-10-30 DIAGNOSIS — J441 Chronic obstructive pulmonary disease with (acute) exacerbation: Secondary | ICD-10-CM

## 2015-10-30 NOTE — Telephone Encounter (Signed)
Spoke with the pt  I advised she needs to call her insurance and obtain drug formulary so we will know what meds are covered  She will call Bridget Fry back with this information

## 2015-10-30 NOTE — Telephone Encounter (Signed)
Patient called [phone] and wants to be called back today.  She states Spiriva is also over $215 and she cannot afford this.

## 2015-10-30 NOTE — Patient Outreach (Signed)
[facility]) Care Management  10/30/2015  Bridget DredgeVictoria Fry 06-12-1931 [id number]  Assessment-CSW completed outreach to patient on 10/30/15. Patient answered and reported that she is very frustrated at this time due to the increased cost of her medications. [facility] currently involved with patient. CSW expressed understanding and provided support through active listening and conversation. CSW provided [facility] contact number for her to make outreach. CSW introduced self, reason for call and of [facility]. Patient reports that she does not wish to pay for any additional services such as Community Health and [employer] Program. Patient allowed CSW to explain program, their benefits, their waiting list and their sliding scale fee. Patient reports that she does not wish to pay for any other program in order to receive care services as she "already pays 12 dollars a hour for my aid." Patient shares that her aid provides assistance 6 hours per day. Patient shares that she does not wish to have any information or resources on personal care services or Community Health and Response program. Patient shares that she will only accept information on services that are free of charge. Patient denies needing social work assistance. CSW provided contact information to patient in case she changed her mind. CSW will not open case as patient declines needing social work assistance.   Plan-Patient will update pharmacy. Patient will discharge patient from caseload at this time and will inform involved [facility] staff.  [name] Berlynn Warsame, BSW, MSW, LCSW [facility] Care Management [email or web address] Phone: [phone] Fax: [phone]

## 2015-10-30 NOTE — Telephone Encounter (Signed)
Received an approval for Xopenex HFA. Valid until 12/12/2016. [id number].  Pharmacy informed.

## 2015-10-30 NOTE — Telephone Encounter (Signed)
Reviewed status on covermymeds. Still awaiting response on PA

## 2015-10-31 ENCOUNTER — Other Ambulatory Visit: Payer: Self-pay

## 2015-10-31 ENCOUNTER — Telehealth: Payer: Self-pay | Admitting: Adult Health

## 2015-10-31 NOTE — Telephone Encounter (Signed)
Her formulary is tier  1 , How much is it, is she in the doughnut hole/gap coverage ??

## 2015-10-31 NOTE — Telephone Encounter (Signed)
Patient states we need to call [phone] today so that she is able to get her med.

## 2015-10-31 NOTE — Telephone Encounter (Signed)
Spoke with pharmacist- states that the Xopenex is ready to be picked up ($69) Still waiting on Spiriva alternative.  Spiriva is $215, pt states that she was advised that she is in the donut hole. Need to make patient aware that Xopenex is ready and to discuss Spiriva. When did Spiriva cost go up? Has it been cheaper earlier this year? If she is in the donut hole now then this is why her med is too expensive.   ATC pt - NA   WCB

## 2015-10-31 NOTE — Telephone Encounter (Signed)
Can we see if there is a tracking system on the brovana /budesonide neb meds.  That is what we need figure out first.  See if atrovent neb Four times a day  In place of spiriva -send to DME  To see if affordable.  D/c xopenex too expensive .  Please let me know if when we work this out.

## 2015-10-31 NOTE — Telephone Encounter (Signed)
Patient calling saying that she never received her nebulizer medication from [facility].  Patient says that she also has not received her Xopenex or Spiriva.  Patient advised that Xopenex was sent to pharmacy and it would cost $69.  Advised her of the below message regarding medications.  She said "who is going to pay for this?"  She also is very upset because she is suppose to get her medications shipped to her, she doesn't want to go to a pharmacy to pick up her medications. I asked her what mail order company she uses and she said she doesn't have a mail order company.  Patient was receiving medications from Hospice but has been removed from Hospice program.  Patient says that she was removed from the program because she "lived too long" and she said that "you don't have to worry about that soon because I am not going to live much longer without my medications".    [facility] [phone];  Had to be transferred to [facility] at [phone]; Pauline AusXopenex has been approved, cost to patient is $69 (tier 5 copay).  Spiriva Handihaler does not require a prior authorization as long as patient is only taking 1 tablet daily. (Tier 3 copay)  Called [phone] to verify cost of copay.  Spiriva Handihaler is $125 - 90 day supply through [facility]. Xopenex HFA is $193.79 for 90 day supply through [facility], 30 day rx through [facility] is $72.     [facility] [address] and requested prices on Xopenex and Spiriva for 30 days rx.  Xopenex is $69.95  And Spiriva is $215.    Called [facility] and spoke to IrelandLynn regarding Brovana and Budesonide, she said that they were shipped 09/30/15 from [facility]. Christine from [facility] advised Larita FifeLynn that patient received the medications.  Larita FifeLynn from [facility] called [facility] to confirm shipment of medications.   60 vials of Brovana and 60 Budesonide.  Delivered to patient on 10/01/15 at 9:45am.  Called and advised patient of above.  Patient says that she did not receive the Brovana and Budesonide, she said  that nothing was ever delivered to her house.  She said that she lives alone and she sleeps until 11am every day, that if it was delivered at 9:45am, she did not get it because she would have been asleep and she did not ever hear her doorbell ring.  She said that she [facility] Daw[facility] as well and that Katherine BassetDawne is also working on this.    TP - patient states that Spiriva and Xopenex are too expensive, she cannot afford these prices.  Please advise.

## 2015-10-31 NOTE — Telephone Encounter (Signed)
Pt aware that Xopenex should be covered by insurance now and she needs to contact her pharmacy to check. Will call us back if it is still too costly.      Renea EeMisty R Ahmad, LPN at [id number]/962911/17/2016 3:07 PM     Status: Signed       Expand All Collapse All   Received an approval for Xopenex HFA. Valid until 12/12/2016. [id number].  Pharmacy informed.       Tammy, please see Drug Formulary and see what alternative could possibly be given for Spiriva that will be more cost efficient for the patient. Thanks.

## 2015-10-31 NOTE — Patient Outreach (Signed)
I called [name] to discuss why she will not qualify for programs if she is unwilling to share her financial status.  I was not able to discuss this topic because she was very upset about not having her medications.  She stated she was out of her budesonide, spiriva, xopenex and brovana.  She does not know what she is going to do if she is not able to get those medications.  [facility] and had all the medications [facility].  I will use the Pharmacy Emergency Fund to purchase a 30 day supply of medication to keep her out of the hospital.  I will deliver them to her house.  She stated she really appreciate the help I was giving her.  I also went over the tiering of these medications and how much they will cost next year with her new insurance plan.  She will need to plan on budgeting for these medications.  I will discuss this with her when I deliver her medications.    Steve Rattlerawn Yi Falletta, PharmD, [employer]BCACP [facility] Pharmacy Manager [phone]

## 2015-10-31 NOTE — Telephone Encounter (Signed)
Pt cb, [phone]

## 2015-10-31 NOTE — Patient Outreach (Signed)
[facility]) Care Management  10/31/2015  Bridget DredgeVictoria Fry [birth year] [id number]   Telephone outreach to patient to assist in completing the patient assistance applications for [employer] and [place] patient assistance programs. Patient was not willing to provide her income information as it's a requirement when applying for these programs. Unfortunately, we were not able to complete the applications. I will communicate with Steve Rattlerawn Pettus, PharmD to see about other options for the patient. As of now, I am removing myself from the care team and willing to assist with any future pharmacy assistance needs.  Colt Martelle L. Jshon Ibe, AAS [facility] LPHN Care Management Assistant

## 2015-11-03 NOTE — Telephone Encounter (Signed)
Spoke with Wynona Caneshristine at [facility] and she said that she saw patient with the medication, she pulled the medication out of the fridge and showed her how to use her nebulizer machine with the medication she took from the fridge.  She said that patient had 2 boxes of Brovana and Budesonide in her refrigerator.  Spoke with Selena BattenKim at [facility] - she said that for Brovana and Budesonide, she pays $157.07 monthly; for Atrovent it would be $20 monthly for 4 times daily; Selena BattenKim says that Albuterol Neb is less than $20/monthly.   Selena BattenKim says that patient was offered financial hardship papers but refused to sign financial hardship paperwork to help pay for her medications because she says that she makes too much money and would not qualify.   TP - please advise.

## 2015-11-04 NOTE — Telephone Encounter (Signed)
Called and spoke with patient. She states she is taking the xopenox, Brovana and budesonide, and spiriva. Patient states she has had several home visits to discuss her meds but has not been able to resolve the medication issue. I explained to her that we had received a call from her stating the xopenox and spiriva were to expense. Financial assistance was offered and refused. Patient stated she does not recall this conversation. I explained to her that TP's recommendations were for her to take Atrovent four times a day in place of spiriva and to discontinue the xopenox. She says she has concerns of reactions to these medication changes. I also asked about a home health nurse that has been seen by [facility] when they have made house visits. Patient stated that she does not have any nurse coming to her house but had occasional transportation to come by for various doctor visits. I explained to her that we would like for her to come into the office with all of her meds so that we can review her medications and discuss her medications questions and concerns. Patient was unable to make ov at this time but ask that I call her later on in the morning to make ov. Will hold in my box until ov is made. Will call patient later this morning.

## 2015-11-04 NOTE — Telephone Encounter (Signed)
Called patient to schedule patient an ov with TP to discuss med's. Patient refused ov with TP or MR stating that MR's recommendations are for her to stay on her current meds. I explained to her that we can complete financial assistance forms and she refuses stating she has already completed them and was stated she did not qualify for assistant. I informed her the only options we have to offer her are the ov with MR or TP for med counseling or a financial hardship assistance. She refused both. Will send to MR for FYI.

## 2015-11-04 NOTE — Telephone Encounter (Signed)
k thanks.  She wont accept reality. She wants what she wants but cannot afford it and she will not accept cheaper versions. Nothing can be done.

## 2015-11-04 NOTE — Telephone Encounter (Signed)
Can we call pt and see what she would like to do , if unable to determine over phone, will need ov to sort out.

## 2015-11-11 ENCOUNTER — Other Ambulatory Visit: Payer: Self-pay | Admitting: Licensed Clinical Social Worker

## 2015-11-11 NOTE — Patient Outreach (Addendum)
[facility]) Care Management  11/11/2015  Bridget Fry 01/30/1931 [id number]   Assessment-CSW will be opening patient's case again for social work as she has ongoing financial issues with recent medication cost and is in need of stable transportation. CSW completed outreach to patient and was able to successfully reach her. [facility] Pharmacist Steve RattlerDawn Pettus has suggested that CSW assist patient with budgeting to help free up funds to pay for need medications as medication cost will not be able to be decreased after pharmacist had made attempts to do so. [facility] Pharmacist has paid for $524.00 of patient's medications for this month. Patient appreciative of [facility] Pharmacist assistance. Patient accepting of social work assistance for both financial assistance and transportation. Patient reports she receives too much money to be able to qualify for certain programs that would help her gain relief from medication expenses. Patient reports that she has "too many things I have to pay for each month. I really can't give up much of anything because I have to have these things like my living expenses, car insurance and medical expenses that I have to pay for." CSW questioned if she would be willing to discontinue cable services in order to free up money and she did not answer. CSW educated patient on [employer] which offers budget counseling, credit counseling, housing counseling, debt management programs and financial education for free. Patient shares that she will consider this. CSW questioned patient's transportation concerns. Patient reports that she has to pay friends to transport her to medical appointments. CSW informed patient that if we could free up transportation cost that it may be one step in being able to afford medications. CSW explained transportation programs: [employer], [employer], [employer] and [employer]. Patient shares interest in completing [employer] application in order to gain  services. CSW will complete home visit on 11/20/15 with NP Carrol Sprinks in order to assist patient with gaining transportation and to discuss ways to free up money in order to cover health care cost. Patient reports that she has a nose bled and wishes for CSW to call back in 5 minutes. CSW contacted both [facility] Pharmacist Steve Rattlerawn Pettus and NP Hal Hopearrol Sprinks for updates after phone call. Dawn wishes to for CSW to help patient complete patient assistance form to free up some medication cost. CSW completed second outreach to patient and reminded her to gain official statements of both her social security and Veteran's benefits. CSW informed patient that NP Carrol Sprinks may be at her home earlier than 12:30 and will be calling in B-12 medication.    Plan-CSW will complete home visit on 11/20/15.   [name] Kassidy Frankson, BSW, MSW, LCSW [facility] Care Management [name] Phone: [phone] Fax: [phone]

## 2015-11-20 ENCOUNTER — Other Ambulatory Visit: Payer: Self-pay | Admitting: Licensed Clinical Social Worker

## 2015-11-20 ENCOUNTER — Ambulatory Visit: Payer: Self-pay | Admitting: *Deleted

## 2015-11-20 NOTE — Patient Outreach (Signed)
[facility]) Care Management  [facility] Social Work  11/20/2015  Bridget Fry 13-Dec-1931 [id number]  Subjective:    Objective:   Current Medications:  Current Outpatient Prescriptions  Medication Sig Dispense Refill  . arformoterol (BROVANA) 15 MCG/2ML NEBU Take 2 mLs (15 mcg total) by nebulization 2 (two) times daily. 240 mL 5  . benzonatate (TESSALON) 200 MG capsule TAKE ONE CAPSULE BY MOUTH THREE TIMES DAILY AS NEEDED. HOSPICE PATIENT 90 capsule 0  . budesonide (PULMICORT) 0.25 MG/2ML nebulizer solution USE ONE VIAL IN NEBULIZER TWICE DAILY 240 mL 5  . cyanocobalamin (,VITAMIN B-12,) 1000 MCG/ML injection INJECT 1 ML INTO THE SKIN ONCE A MONTH 10 mL 0  . dextromethorphan-guaiFENesin (MUCINEX DM) 30-600 MG per 12 hr tablet Take 1 tablet by mouth 2 (two) times daily as needed.     . diazepam (VALIUM) 5 MG tablet Take 1 tablet (5 mg total) by mouth at bedtime as needed for sedation. 30 tablet 2  . ferrous sulfate 325 (65 FE) MG tablet Take 325 mg by mouth 2 (two) times daily with a meal.     . FLUoxetine (PROZAC) 20 MG tablet Take 20 mg by mouth 2 (two) times daily.      . folic acid (FOLVITE) 1 MG tablet Take 1 mg by mouth daily.    . furosemide (LASIX) 40 MG tablet One tablet daily (Patient taking differently: 1.5 mg. One tablet daily)    . gabapentin (NEURONTIN) 100 MG capsule Take 1 capsule by mouth 3 (three) times daily as needed.    Marland Kitchen glipiZIDE (GLUCOTROL) 5 MG tablet Take 5 mg by mouth 2 (two) times daily before a meal.     . HYDROcodone-homatropine (HYCODAN) 5-1.5 MG/5ML syrup Take 5 mLs by mouth every 6 (six) hours as needed. HOSPICE PATIENT 120 mL 0  . levalbuterol (XOPENEX HFA) 45 MCG/ACT inhaler Inhale 2 puffs into the lungs as needed. Through Medicare part B 1 Inhaler 5  . Loperamide HCl (IMODIUM PO) Take by mouth as needed.    . meclizine (ANTIVERT) 12.5 MG tablet Take 12.5 mg by mouth every 4 (four) hours as needed.    . NON FORMULARY Tumeric Tablets twice  a day    . pantoprazole (PROTONIX) 40 MG tablet Take 40 mg by mouth daily.      . potassium chloride (KLOR-CON) 10 MEQ CR tablet Take 10 mEq by mouth daily.      . predniSONE (DELTASONE) 10 MG tablet Take 0.5 tablets (5 mg total) by mouth daily with breakfast.  every other day. 30 tablet 2  . promethazine (PHENERGAN) 25 MG tablet 25 mg every 8 (eight) hours as needed.     . sitaGLIPtin (JANUVIA) 50 MG tablet Take 100 mg by mouth daily.     Marland Kitchen tiotropium (SPIRIVA) 18 MCG inhalation capsule Place 1 capsule (18 mcg total) into inhaler and inhale daily. 30 capsule 5   No current facility-administered medications for this visit.    Functional Status:  In your present state of health, do you have any difficulty performing the following activities: 10/03/2015  Hearing? N  Vision? N  Difficulty concentrating or making decisions? N  Walking or climbing stairs? Y  Dressing or bathing? Y  Doing errands, shopping? Y  Preparing Food and eating ? Y  Using the Toilet? N  In the past six months, have you accidently leaked urine? Y  Do you have problems with loss of bowel control? N  Managing your Medications? Y  Managing your  Finances? Y  Housekeeping or managing your Housekeeping? Y    Fall/Depression Screening:  PHQ 2/9 Scores 10/03/2015  PHQ - 2 Score 4  PHQ- 9 Score 14    Assessment: CSW completed home visit with patient on 11/20/15. Patient's aide was currently there during this time. Patient was on the phone with her insurance agent named Jacki ConesRob Leonard from [employer]. CSW spoke to insurance agent and he informed CSW that he did not think she would be able to qualify for patient assistance program due to her assets. Patient reports that she had been informed this before. Patient shares that she has been working with agent for years. Patient is still agreeable to completing patient assistance program but had a difficult time finding documentation of her income. Aide was able to assist and  a bank statement was found that included both social security and VA pension. Patient assistance application completed and will be hand delivered to Care Management Assistant, [name]. CSW then took time to evaluate patient's income and expenses. CSW provided patient with three different monthly budget work sheets and helped her complete one which included expenses such as car insurance, rent, cable, income, groceries, gas, water, etc. Work sheet also included a savings part where she could assess how much she could put back for her medications. Patient makes $3,065 per month with both incomes. Patient's rent is $475 per month. Patient's cable bill is $68.00 per month. Patient spends anywhere from $150.00 to $200.00 per month on groceries. Patient's car insurance is $103.92 per month and patient usually spends $50.00 per month on gas. Patient pays aides to stay with her from Monday to Friday from 11 am - 2 pm and 6 pm - 9 pm. Patient spends $360 dollars per week on her aides which help cook her food, transport her to appointments, clean her house, assist her with getting bathed and dressed, goes grocery shopping for her and any other needs. Patient shares that she is not sure how she can eliminate any of her expenses as "I need them all. I get basic cable and phone. I don't spend money on things I don't need." CSW questioned if she would be willing to discontinue cable bill and she declined. CSW provided patient with a list of budgeting tips and reviewed these with her. Patient reports that she has dental issues that she needs to financial assistance with as well. Patient is familiar with [employer] as she has used their services before when exacting a tooth. CSW will send patient a list of dental resources that she can use. Patient agreeable to this resource. Patient shares that she is willing to use the budget worksheets but shares "I don't think anything can help." CSW explained transportation  resources such as [employer] and [employer] free of charge as patient had previously discussed an interest in receiving assistance. However, patient shares that she cannot use any services as she needs someone to wait with her and she cannot have long waiting time. CSW provided patient with a list of transportation resources in case she changed her mind as this could alleviate one expense as she pays her aides to transport her. CSW suggested that patient attend Credit Consumer Counseling services and receive one on one individual sessions in regards to budgeting. Patient shares that she will consider this but reports again "it will not help." CSW completed psychosocial assessment with patient. Patient denies any substance use or depression. Patient reports "I'm not sad but I  am angry and frustrated with all that is going on with my medications." Patient shares that she has a difficult time with her mobility which makes it difficult to socialize outside of her home. However, patient shares that her main activities that are pleasurable to her are reading daily, writing, being organized, watching the news, talking to her aids and talking to her son. Patient reports no interest in [employer] to improve socialization. Patient also declines advance directive information. CSW was very Adult nurse of social work assistance. Plan is for Nurse Practitioner Carrol Sprinks to complete home visit after later in the afternoon.  Plan: CSW will hand deliver patient assistance application to Lesle Reek, [facility] Care Management Assistant. CSW will continue to assist and encourage patient in learning to budget her income in order to afford needed medications. CSW send in basket message to Sherle Poe, [facility] Care Management Assistant to request that she mail out a list of Dental Resources.   [facility] CM Care Plan Problem One        Most Recent Value   Care Plan Problem One  Lack of resources in order to pay for  health care cost.   Role Documenting the Problem One  Clinical Social Worker   Care Plan for Problem One  Active   [facility] Long Term Goal (31-90 days)  Patient will be educated on budgeting and how to free up expenses in order to pay for medications   [facility] Long Term Goal Start Date  11/20/15   Interventions for Problem One Long Term Goal  CSW will assit patient with patient assistnance application in order to gain assistance with paying for medications. CSW will assist patient in learning how to budget and will refer her to Credit Consumer Counseling.      Dickie La, BSW, MSW, LCSW [facility] [email or web address] Phone: [phone] Fax: [phone]

## 2015-11-21 ENCOUNTER — Other Ambulatory Visit: Payer: Self-pay | Admitting: *Deleted

## 2015-11-21 ENCOUNTER — Encounter: Payer: Self-pay | Admitting: *Deleted

## 2015-11-21 NOTE — Patient Outreach (Signed)
Routine home visit:  S:  Pt very worried about getting her most necessary respiratory meds. [facility] has provided a 2 week supply and in the process of applying for financial assitance for more free medication.  She worries a lot about her finances and in paying her bills and paying her sitters that provide in home care for her.  Pt has not been feeling well, more congested that usual and she has been put on a Z-pack. Today she feels a little bit better.  O:  BP 120/70 mmHg  Pulse 98  Resp 20  SpO2 98%       RRR       Lungs are clear but diminished - pt wearing her O2       1+ pedal edema       Onychomycotic toenails which are very long.  A:  COPD End stage       DM        Onychomycosis  P:  Provided 3 doses of B12 1000 units for future in home injections.       Debrided her very needy toenails       Sent our pharmacist an urgent message about her needed nebs supplies.       Encouaged pt to call me if any problems to prevent complications.       I will see her in a month.  Almetta LovelyCarroll Prabhav Faulkenberry [facility]NP-BC [facility] Care Manager [phone]

## 2015-11-26 ENCOUNTER — Telehealth: Payer: Self-pay | Admitting: Internal Medicine

## 2015-11-26 NOTE — Telephone Encounter (Signed)
Spoke with patient, received medication via mail today. Nothing further needed.

## 2015-11-27 ENCOUNTER — Telehealth: Payer: Self-pay | Admitting: Adult Health

## 2015-11-27 MED ORDER — TIOTROPIUM BROMIDE MONOHYDRATE 18 MCG IN CAPS: 18.0000 ug | ORAL_CAPSULE | Freq: Every day | RESPIRATORY_TRACT | Status: DC

## 2015-11-27 MED ORDER — BUDESONIDE 0.25 MG/2ML IN SUSP: RESPIRATORY_TRACT | Status: AC

## 2015-11-27 NOTE — Telephone Encounter (Signed)
Spoke with Bridget Fry. She brought by patient assistance forms for Pulmicort and Spiriva. TP needs to sign these forms and the prescriptions that go along with them. Will give these to Marcelino DusterMichelle who is working with her today.  Bridget Fry will need to be called at [phone] when these are completed.

## 2015-11-27 NOTE — Telephone Encounter (Signed)
Paperwork completed and left at front for Damita to pick up. Called and left message for Damita to call back. Awaiting call back from [facility].

## 2015-11-28 ENCOUNTER — Ambulatory Visit: Payer: Medicare Other | Admitting: Adult Health

## 2015-11-28 ENCOUNTER — Telehealth: Payer: Self-pay | Admitting: Adult Health

## 2015-11-28 NOTE — Telephone Encounter (Signed)
Left message on Bridget Fry's confidential voicemail letting her know that these forms were ready to be picked up. Nothing further was needed.

## 2015-11-28 NOTE — Telephone Encounter (Signed)
Per 12/14 phone note the budesonide was received at her home Called pt and line rings busy x 3 WCB

## 2015-12-01 NOTE — Telephone Encounter (Signed)
Pt states that there must have been a misunderstanding somewhere - states that she needs her Budesonide and Brovana refilled ASAP, pt is out of these meds.  [facility]ills) and Budesonide (sent 11/27/2015 with 5 refills). Nothing further needed.

## 2015-12-04 ENCOUNTER — Encounter: Payer: Self-pay | Admitting: Adult Health

## 2015-12-04 ENCOUNTER — Ambulatory Visit (INDEPENDENT_AMBULATORY_CARE_PROVIDER_SITE_OTHER): Payer: Medicare Other | Admitting: Adult Health

## 2015-12-04 VITALS — BP 144/70 | HR 94 | Temp 98.6°F | Ht 61.0 in | Wt 159.8 lb

## 2015-12-04 DIAGNOSIS — J9611 Chronic respiratory failure with hypoxia: Secondary | ICD-10-CM | POA: Diagnosis not present

## 2015-12-04 DIAGNOSIS — J449 Chronic obstructive pulmonary disease, unspecified: Secondary | ICD-10-CM | POA: Diagnosis not present

## 2015-12-04 MED ORDER — TIOTROPIUM BROMIDE MONOHYDRATE 18 MCG IN CAPS: 18.0000 ug | ORAL_CAPSULE | Freq: Every day | RESPIRATORY_TRACT | Status: AC

## 2015-12-04 NOTE — Progress Notes (Signed)
Subjective:    Patient ID: Bridget Fry, female    DOB: 25-Oct-1931, 81 y.o.   MRN: [id number]  HPI  81 yo female with COPD and chronic resp failure   12/04/2015 Follow up :  Pt returns for 6 week follow up .  She has moderate to severe COPD on O2 .  Remains on 3l/m of O2.  Remains on Brovana and Pulmicort neb . Remains on Spiriva .  On prednisone  every other day.  Breathing is doing about the same. Gets  SOB with activity.  Has min productive cough with clear to white  chest congestion, nausea, and sinus drainage.  Denies cough, fever or vomiting. Flu , PVX and Prevnar utd.  Discussed cost of nebs, APS reports they are covered by her MCR.      Past Medical History  Diagnosis Date  . Other B-complex deficiencies   . Thrombocytopenia, unspecified (HCC)   . Esophageal reflux   . Unspecified essential hypertension   . Depressive disorder, not elsewhere classified   . Anxiety state, unspecified   . Chronic airway obstruction, not elsewhere classified    Current Outpatient Prescriptions on File Prior to Visit  Medication Sig Dispense Refill  . arformoterol (BROVANA) 15 MCG/2ML NEBU Take 2 mLs (15 mcg total) by nebulization 2 (two) times daily. 240 mL 5  . benzonatate (TESSALON) 200 MG capsule TAKE ONE CAPSULE BY MOUTH THREE TIMES DAILY AS NEEDED. HOSPICE PATIENT 90 capsule 0  . budesonide (PULMICORT) 0.25 MG/2ML nebulizer solution USE ONE VIAL IN NEBULIZER TWICE DAILY 240 mL 5  . cyanocobalamin (,VITAMIN B-12,) 1000 MCG/ML injection INJECT 1 ML INTO THE SKIN ONCE A MONTH 10 mL 0  . dextromethorphan-guaiFENesin (MUCINEX DM) 30-600 MG per 12 hr tablet Take 1 tablet by mouth 2 (two) times daily as needed.     . diazepam (VALIUM) 5 MG tablet Take 1 tablet (5 mg total) by mouth at bedtime as needed for sedation. 30 tablet 2  . ferrous sulfate 325 (65 FE) MG tablet Take 325 mg by mouth 2 (two) times daily with a meal.     . FLUoxetine (PROZAC) 20 MG tablet Take 20 mg by mouth 2  (two) times daily.      . folic acid (FOLVITE) 1 MG tablet Take 1 mg by mouth daily.    . furosemide (LASIX) 40 MG tablet One tablet daily (Patient taking differently: Take 60 mg by mouth daily. One tablet daily)    . gabapentin (NEURONTIN) 100 MG capsule Take 1 capsule by mouth 3 (three) times daily as needed.    Marland Kitchen glipiZIDE (GLUCOTROL) 5 MG tablet Take 5 mg by mouth 2 (two) times daily before a meal.     . HYDROcodone-homatropine (HYCODAN) 5-1.5 MG/5ML syrup Take 5 mLs by mouth every 6 (six) hours as needed. HOSPICE PATIENT 120 mL 0  . levalbuterol (XOPENEX HFA) 45 MCG/ACT inhaler Inhale 2 puffs into the lungs as needed. Through Medicare part B 1 Inhaler 5  . Loperamide HCl (IMODIUM PO) Take by mouth as needed.    . meclizine (ANTIVERT) 12.5 MG tablet Take 12.5 mg by mouth every 4 (four) hours as needed.    . NON FORMULARY Tumeric Tablets twice a day    . pantoprazole (PROTONIX) 40 MG tablet Take 40 mg by mouth daily.      . potassium chloride (KLOR-CON) 10 MEQ CR tablet Take 10 mEq by mouth daily.      . predniSONE (DELTASONE) 10 MG tablet Take  0.5 tablets (5 mg total) by mouth daily with breakfast. 5mg  every other day. 30 tablet 2  . promethazine (PHENERGAN) 25 MG tablet 25 mg every 8 (eight) hours as needed.     . sitaGLIPtin (JANUVIA) 50 MG tablet Take 100 mg by mouth daily.     Marland Kitchen. tiotropium (SPIRIVA) 18 MCG inhalation capsule Place 1 capsule (18 mcg total) into inhaler and inhale daily. 30 capsule 5   No current facility-administered medications on file prior to visit.     Review of Systems Constitutional:   No  weight loss, night sweats,  Fevers, chills, + fatigue, or  lassitude.  HEENT:   No headaches,  Difficulty swallowing,  Tooth/dental problems, or  Sore throat,                No sneezing, itching, ear ache, nasal congestion, post nasal drip,   CV:  No chest pain,  Orthopnea, PND,  , anasarca, dizziness, palpitations, syncope.   GI  No heartburn, indigestion, abdominal  pain, nausea, vomiting, diarrhea, change in bowel habits, loss of appetite, bloody stools.   Resp:  .  No chest wall deformity  Skin: no rash or lesions.  GU: no dysuria, change in color of urine, no urgency or frequency.  No flank pain, no hematuria   MS:  No joint pain or swelling.  No decreased range of motion.  No back pain.  Psych:  No change in mood or affect. No depression or anxiety.  No memory loss.         Objective:   Physical Exam   Filed Vitals:   12/04/15 1508  BP: 144/70  Pulse: 94  Temp: 98.6 F (37 C)  TempSrc: Oral  Height: 5\' 1"  (1.549 m)  Weight: 159 lb 12.8 oz (72.485 kg)  SpO2: 97%    GEN: A/Ox3; pleasant , NAD, chronically ill appearing in wc on O2   HEENT:  [place]/AT,  EACs-clear, TMs-wnl, NOSE-clear, THROAT-clear, no lesions, no postnasal drip or exudate noted.   NECK:  Supple w/ fair ROM; no JVD; normal carotid impulses w/o bruits; no thyromegaly or nodules palpated; no lymphadenopathy.  RESP  Decreased BS in bases , no accessory muscle use, no dullness to percussion  CARD:  RRR, GR 2 SM  , tr peripheral edema, pulses intact, no cyanosis or clubbing.  GI:   Soft & nt; nml bowel sounds; no organomegaly or masses detected.  Musco: Warm bil, no deformities or joint swelling noted.   Neuro: alert, no focal deficits noted.    Skin: Warm, no lesions or rashes         Assessment & Plan:

## 2015-12-04 NOTE — Assessment & Plan Note (Signed)
Cont on O2 .  

## 2015-12-04 NOTE — Patient Instructions (Signed)
Continue on current regimen .  follow up Dr. Ramaswamy in 3 months and As needed   

## 2015-12-04 NOTE — Assessment & Plan Note (Signed)
Compensated on present regimen   Plan  Continue on current regimen .  follow up Dr. Marchelle Gearingamaswamy in 3 months and As needed

## 2015-12-04 NOTE — Addendum Note (Signed)
Addended by: Karalee HeightOX, Brennden Masten P on: 12/04/2015 03:51 PM   Modules accepted: Orders

## 2015-12-15 ENCOUNTER — Other Ambulatory Visit: Payer: Self-pay

## 2015-12-15 NOTE — Patient Outreach (Signed)
[facility]) Care Management  [facility]   12/15/2015  Bridget Fry 1931-07-10 [id number]  Subjective: Bridget Fry is an 82 year old female with past medical history of COPD, vitamin B 12 deficiency, depression, GERD, abdominal pain and Hypertension.  I have been following her over the last couple of months assisting her with her medications, mainly her nebulizing medications and inhalers.  Since she has been released from hospice she has not been able to afford them.  She has to pay 20% the cost of her budesonide and Brovona.  The Xopenex and Spiriva are Tier 3 and 4 medications.  Her provider has been able to get a Tier exception for Xopenex under [facility] but Bridget Fry has changed insurance to [employer].  New Tier exemption forms will need to be completed.  Under [employer], Brovana and Xopenex are nonformulary, budesonide is a tier 3 but requires a PA because it is a B versus D drug and Spiriva is a Tier 3.   Objective:   Current Medications: Current Outpatient Prescriptions  Medication Sig Dispense Refill  . arformoterol (BROVANA) 15 MCG/2ML NEBU Take 2 mLs (15 mcg total) by nebulization 2 (two) times daily. 240 mL 5  . benzonatate (TESSALON) 200 MG capsule TAKE ONE CAPSULE BY MOUTH THREE TIMES DAILY AS NEEDED. HOSPICE PATIENT 90 capsule 0  . budesonide (PULMICORT) 0.25 MG/2ML nebulizer solution USE ONE VIAL IN NEBULIZER TWICE DAILY 240 mL 5  . cyanocobalamin (,VITAMIN B-12,) 1000 MCG/ML injection INJECT 1 ML INTO THE SKIN ONCE A MONTH 10 mL 0  . diazepam (VALIUM) 5 MG tablet Take 1 tablet (5 mg total) by mouth at bedtime as needed for sedation. 30 tablet 2  . ferrous sulfate 325 (65 FE) MG tablet Take 325 mg by mouth 2 (two) times daily with a meal.     . FLUoxetine (PROZAC) 20 MG tablet Take 20 mg by mouth 2 (two) times daily.      . folic acid (FOLVITE) 1 MG tablet Take 1 mg by mouth daily.    . furosemide (LASIX) 40 MG tablet One tablet daily  (Patient taking differently: Take 60 mg by mouth daily. One tablet daily)    . gabapentin (NEURONTIN) 100 MG capsule Take 1 capsule by mouth 3 (three) times daily as needed.    Marland Kitchen glipiZIDE (GLUCOTROL) 5 MG tablet Take 5 mg by mouth 2 (two) times daily before a meal.     . HYDROcodone-homatropine (HYCODAN) 5-1.5 MG/5ML syrup Take 5 mLs by mouth every 6 (six) hours as needed. HOSPICE PATIENT 120 mL 0  . Loperamide HCl (IMODIUM PO) Take by mouth as needed.    . meclizine (ANTIVERT) 12.5 MG tablet Take 12.5 mg by mouth every 4 (four) hours as needed.    . NON FORMULARY Tumeric Tablets twice a day    . pantoprazole (PROTONIX) 40 MG tablet Take 40 mg by mouth daily.      . potassium chloride (KLOR-CON) 10 MEQ CR tablet Take 10 mEq by mouth daily.      . predniSONE (DELTASONE) 10 MG tablet Take 0.5 tablets (5 mg total) by mouth daily with breakfast. 5mg  every other day. 30 tablet 2  . promethazine (PHENERGAN) 25 MG tablet 25 mg every 8 (eight) hours as needed.     . sitaGLIPtin (JANUVIA) 50 MG tablet Take 100 mg by mouth daily.     Marland Kitchen tiotropium (SPIRIVA) 18 MCG inhalation capsule Place 1 capsule (18 mcg total) into inhaler and  inhale daily. 30 capsule 5  . dextromethorphan-guaiFENesin (MUCINEX DM) 30-600 MG per 12 hr tablet Take 1 tablet by mouth 2 (two) times daily as needed. Reported on 12/15/2015    . levalbuterol (XOPENEX HFA) 45 MCG/ACT inhaler Inhale 2 puffs into the lungs as needed. Through Medicare part B 1 Inhaler 5   No current facility-administered medications for this visit.    Functional Status: In your present state of health, do you have any difficulty performing the following activities: 10/03/2015  Hearing? N  Vision? N  Difficulty concentrating or making decisions? N  Walking or climbing stairs? Y  Dressing or bathing? Y  Doing errands, shopping? Y  Preparing Food and eating ? Y  Using the Toilet? N  In the past six months, have you accidently leaked urine? Y  Do you have  problems with loss of bowel control? N  Managing your Medications? Y  Managing your Finances? Y  Housekeeping or managing your Housekeeping? Y    Fall/Depression Screening: PHQ 2/9 Scores 11/20/2015 10/03/2015  PHQ - 2 Score 1 4  PHQ- 9 Score - 14    Assessment: New Tier exemption forms will need to be completed.  Under [employer], Brovana and Xopenex are nonformulary, budesonide is a tier 3 but requires a PA because it is a B versus D drug and Spiriva is a Tier 3.    Drugs sorted by system:  Neurologic/Psychologic: diazepam, fluoxetine, gabapentin, meclizine  Cardiovascular: furosemide  Pulmonary/Allergy: Brovana, budesonide, prednisone, tiotropium, guaifenesin, levoalbuterol  Gastrointestinal: loperamide, pantoprazole, promethazine,  Endocrine: glipizide, sitagliptin   Renal: none  Topical: none  Pain: hydrocodone-homatropine  Vitamins/Minerals: cyanocobalamin, ferrous sulfate, folic acid, potassium  Infectious Diseases: none  Miscellaneous:  benzonatate   Duplications in therapy: none Gaps in therapy: none Medications to avoid in the elderly: pantoprazole (increase risk of C. Diff and fractures) Drug interactions: none Other issues noted: none  Plan: 1.  I will send the information to her provider regarding her pulmonary medication and the required tier and formulary exception requests. 2.  With her history of GERD and abdominal pain, she most likely has failed other treatments such as H2 blockers (such as famotidine) and PPI are the best options for her.  3.  I will continue to work with Bridget Fry until a resolution can be found regarding the cost of her medications.  4.  I will follow up with Bridget Lovelyarroll Spinks, FNP, her nurse case manager regarding my current findings.   Steve Rattlerawn Alvilda Mckenna, PharmD, [employer] [facility] Pharmacy Manager [phone]

## 2015-12-16 ENCOUNTER — Other Ambulatory Visit: Payer: Self-pay | Admitting: Licensed Clinical Social Worker

## 2015-12-16 NOTE — Patient Outreach (Signed)
[facility] Care Management  12/16/2015  Bridget DredgeVictoria Fry [birth year] [id number]   Assessment-CSW completed outreach to patient on 12/16/15 and patient answered. Patient reports that she had a great holiday. Patient shares that she continues to search for ways to afford her medications. Patient is agreeable to reviewing previous community resources available again. After review, patient reports that community resources and budget resources will not suffice. Patient refuses to eliminate any of her monthly expenses. Patient appreciative of social work assistance but shares that she is unable to budget any of her income at this time and denies suggested referrals. Patient denies wishing to go to [employer] counseling which can help her to budget monthly income in order to free up money for medication cost. Patient agreeable to social work discharge. Patient is aware that [facility] Pharmacist continues to seek a solution to affording her medications.  Plan-CSW will update [facility] FNP and Pharmacist.   [name] Rondel Episcopo, BSW, MSW, LCSW [facility] Care Management [email or web address] Phone: [phone] Fax: [phone]

## 2015-12-25 ENCOUNTER — Telehealth: Payer: Self-pay | Admitting: Adult Health

## 2015-12-25 ENCOUNTER — Ambulatory Visit: Payer: Self-pay | Admitting: *Deleted

## 2015-12-25 NOTE — Telephone Encounter (Signed)
Spoke with pt, wanted to let TP know she received her neb meds in the mail yesterday.   Nothing further needed.  Forwarding to TP as FYI.

## 2015-12-25 NOTE — Telephone Encounter (Signed)
Received a form from [employer] stating that the patient was denied for Patient Assistance due to non-Medicare insurance that covers prescription drugs  Called and spoke with Aisha with [employer] [phone]( [phone]). She states a letter of appeal needs to be done due to the fact that the application stated that she did not have coverage through medicare. She says that all that needs to be done is the application be marked correctly and initialed. She states that the application will need to be resubmitted with proof of income, rx, insurance card, a letter from our office stating that the 1st application was marked incorrectly by the patient, and a corrected application. She is faxing a new application to [phone]. Will await fax

## 2015-12-30 NOTE — Telephone Encounter (Signed)
Called and spoke with Tera with [employer]. I explained to her that we have not received fax. She stated she was resending fax to [phone]. Will await fax.

## 2015-12-31 NOTE — Telephone Encounter (Signed)
Awaiting fax.

## 2016-01-05 ENCOUNTER — Other Ambulatory Visit: Payer: Self-pay | Admitting: *Deleted

## 2016-01-05 NOTE — Telephone Encounter (Signed)
Called and spoke with patient. Informed her that a new Patient assistant application form needs to be completed. I explained to her that she would need to complete the forms with up to date information. I explained to her that she could come to the ov to pick up the forms or I could mail them to her. Pt verified address in epic. She voiced understanding and had no further questions. Application has been placed in out going mail. Nothing further needed at this time. Will await completed forms to return.

## 2016-01-05 NOTE — Patient Outreach (Signed)
Acute home visit.  S:  Pharmacist, [employer], called me earlier this afternoon reporting that she had talked to Bridget Fry and that she didn't seem to be doing well at all. She asked if there was anyway that I could see her today.  I arrived and found pt in bed, her caregiver is present. She says she is miserable with her abdomen being so distended, it hurts at a level of 10/10 right now. This makes it even harder for her to get her breath! She is moving her bowels daily and they are black and she has some bright red blood from her hemmorhoids. She says she has had black stools for a year.  O:  Pt is awake and alert, pale. Cooperative with exam       BP 110/50 mmHg  Pulse 105  Resp 18  SpO2 98% FBS 192       RRR       Lungs are clear but diminished with O2 on per Bridget Fry at 3L.       Abdomen is distended and firm. BS are present in all quadrants.       Pt was able to walk to the bathroom. She expelled some gas but no BM.  A:  Acute abdomen with end stage COPD  P:  Discussed options with pt: Rectal exam - refused                                                  Imaging outpatient to determine if she has a fluid accumulation that could be reduced.                                                  Hospice - pt agrees to and will think about imaging.      Called Dr. Donette Larry and updated him on pt condition, he spoke to pt on speaker. They agreed Hospice would be right decision and she will consider the imaging. Dr. Donette Larry will call Hospice. I called Josh Borders, [facility], NP who referred pt to me and advised we are going to refer her back at this time.  I will call pt tomorrow and follow up on her status.  I have advised her to take her diazepam if she is very anxious and to take her pain med if her pain becomes too great.  [name] [facility] Care Manager [phone]

## 2016-01-09 ENCOUNTER — Other Ambulatory Visit: Payer: Self-pay | Admitting: Internal Medicine

## 2016-01-15 ENCOUNTER — Other Ambulatory Visit: Payer: Self-pay

## 2016-01-15 NOTE — Patient Outreach (Signed)
[facility]) Care Management  01/15/2016  Bridget Fry 01/06/1931 [id number]   Notification received from Steve Rattler, PharmD that patient is now in hospice. I will remove myself from care team since no further action is needed for pharmacy assistance.  Suzana Sohail L. Thalia Turkington, AAS [facility] Care Management Assistant

## 2016-01-15 NOTE — Patient Outreach (Signed)
Bridget Fry is currently in hospice.  I will close her case to pharmacy.   Steve Rattler, PharmD, [employer] [facility] consultant [phone]

## 2016-01-20 ENCOUNTER — Telehealth: Payer: Self-pay | Admitting: Hematology

## 2016-01-20 NOTE — Telephone Encounter (Signed)
Pt aware of appt. On 2/13@12 :30

## 2016-01-20 NOTE — Telephone Encounter (Signed)
"  I received call with appointment with Dr. Candise Che and did not write down the date."  Date provided.  Asked if this could be rescheduled as she see's pulmonary doctor at the same time.  Appointment at [facility] at 1:00 pm appointment with [place] [facility] is 3:30 pm.  Thanked me for clarification and will do both appointments same day.

## 2016-01-23 ENCOUNTER — Telehealth: Payer: Self-pay | Admitting: Internal Medicine

## 2016-01-23 ENCOUNTER — Telehealth: Payer: Self-pay | Admitting: *Deleted

## 2016-01-23 NOTE — Telephone Encounter (Signed)
Spoke with Domingo Pulse at [facility], aware of below recs.  Nothing further needed.

## 2016-01-23 NOTE — Telephone Encounter (Signed)
Received call from [place], RN @ [facility]  Requesting to cancel pt's appt with Dr. Candise Che.  Per Sharman Crate, pt will be seen by Dr. Gibson Ramp from hospice.  Pt's primary Dr. Eula Listen wanted Sharman Crate to call this clinic to cancel appt with Dr. Candise Che. Maura's  Phone    [phone].

## 2016-01-23 NOTE — Telephone Encounter (Signed)
Spoke with Moira at [facility], pt is back on hospice for possible myelodysplasia.  Hospice tried to draw labs on pt for this, but did not have successful sticks-wants to know if these labs can be drawn at Monday ov.   CBC with Diff- dx code D70.9, and Iron tibc- dx code D50.50.0.  Domingo Pulse states that hospice will pay for these labs.    Also, hospice physician suggested pt going on daily azithromycin.  Pt's attending has agreed with this course of tx, but requests MR's recs on this as well.    Domingo Pulse also requests that we fax completed ov notes and lab results to hospice at [phone].    Forwarding to MR and Robynn Pane to make aware of for Monday's visit.  Also, please advise on azithromycin recs.  Thanks!

## 2016-01-23 NOTE — Telephone Encounter (Signed)
Daily azithromycin  ok  Also, since hospice dx is Myelodysplasia I will NOT be attending of record. It has to be the cancer doc or Sedalia Muta, MD - all cbc etc, is throug  Hospice attending

## 2016-01-26 ENCOUNTER — Ambulatory Visit: Payer: Medicare Other | Admitting: Hematology

## 2016-01-26 ENCOUNTER — Ambulatory Visit: Payer: Medicare Other | Admitting: Internal Medicine

## 2016-02-10 ENCOUNTER — Other Ambulatory Visit: Payer: Self-pay | Admitting: *Deleted

## 2016-02-10 NOTE — Patient Outreach (Signed)
Called pt to make a discharge phone call now that Hospice is fully active. There was no answer. I was not able to leave a message, the pt does not have an answering machine.   I also called Elouise Munroe, NP, for Hospice and Palliative care as he was provider that had referred her to me. I wanted to see if I could get an update on her. I left him a message.  [name] [facility] Care Manager [phone]

## 2016-02-11 DEATH — deceased

## 2016-02-19 ENCOUNTER — Encounter: Payer: Self-pay | Admitting: *Deleted

## 2016-02-19 ENCOUNTER — Other Ambulatory Visit: Payer: Self-pay | Admitting: *Deleted

## 2016-02-19 ENCOUNTER — Encounter: Payer: Self-pay | Admitting: Licensed Clinical Social Worker

## 2016-02-19 NOTE — Patient Outreach (Signed)
I have tried to reach pt several times to follow up with pt since we referred her back to Hospice but have not been able to reach Bridget Fry. I am closing her case as she is on Hospice now and receiving the appropriate care.  Bridget Fry [facility] Care Manager [phone]

## 2016-06-11 NOTE — Patient Outreach (Signed)
[facility]) Care Management  06/11/2016  Bridget DredgeVictoria Fry Sep 02, 1931 [id number]   Addendum- Correction on note completed today.  [name] [name], BSW, MSW, LCSW [facility] Care Management [name] Phone: [phone] Fax: [phone]

## 2016-12-24 NOTE — Patient Outreach (Signed)
[facility]) Care Management  12/24/2016  Trixie DredgeVictoria Holycross 06/06/1931 [id number]   Encounter created by mistake. Please disregard.  [name] Makenlee Mckeag, BSW, MSW, LCSW [facility] Care Management [email or web address] Phone: [phone] Fax: [phone]

## 2017-01-02 ENCOUNTER — Telehealth: Payer: Self-pay | Admitting: Internal Medicine

## 2017-01-02 NOTE — Telephone Encounter (Signed)
Bridget PaneElise  Not heard from GeorgiaVictoria Majewski in a y Scientist, clinical (histocompatibility and immunogenetics)ear. Please check on status  Dr. Kalman ShanMurali Angelia Hazell, M.D., [facility] Pulmonary and Critical Care Medicine Staff Physician [facility] [place] [facility] and Critical [facility] Pager: [phone], If no answer or between  15:00h - 7:00h: call [phone]  01/02/2017 3:13 PM

## 2017-01-03 NOTE — Telephone Encounter (Signed)
ATC pt, line not working. Called and spoke to Joaquim LaiMargaret Lindsay, pt's friend, EC. Claris CheMargaret states pt passed away last year. She did not give details.   Will forward to MR as FYI.

## 2017-01-04 NOTE — Telephone Encounter (Signed)
I fugured she passed. Pleas update chart. Sweet lady.   Dr. Kalman ShanMurali Edelmiro Innocent, M.D., [facility]P Pulmonary and Critical Care Medicine Staff Physician [facility] [place] Pulmonary and Critical Care Pager: [phone], If no answer or between  15:00h - 7:00h: call [phone]  01/04/2017 8:00 PM

## 2017-01-11 NOTE — Telephone Encounter (Signed)
Called and left message for Bridget Fry with Medical Records at [phone] to change status, pt is deceased.

## 2017-01-18 NOTE — Telephone Encounter (Signed)
Spoke with Marcelino DusterMichelle with medical records and pt has been notified as deceased and has been changed in the chart. Nothing further is needed.
